# Patient Record
Sex: Female | Born: 1935 | Race: White | Hispanic: No | Smoking: Never smoker
Health system: Southern US, Community
[De-identification: ages and names within clinical notes are randomized; demographics above are authoritative.]

## PROBLEM LIST (undated history)

## (undated) DIAGNOSIS — F0390 Unspecified dementia without behavioral disturbance: Secondary | ICD-10-CM

## (undated) DIAGNOSIS — J189 Pneumonia, unspecified organism: Secondary | ICD-10-CM

---

## 2008-10-17 ENCOUNTER — Emergency Department (HOSPITAL_COMMUNITY): Admission: EM | Admit: 2008-10-17 | Discharge: 2008-10-17 | Payer: Self-pay | Admitting: Family Medicine

## 2009-06-20 ENCOUNTER — Emergency Department (HOSPITAL_BASED_OUTPATIENT_CLINIC_OR_DEPARTMENT_OTHER): Admission: EM | Admit: 2009-06-20 | Discharge: 2009-06-20 | Payer: Self-pay | Admitting: Emergency Medicine

## 2009-06-21 ENCOUNTER — Emergency Department (HOSPITAL_COMMUNITY): Admission: EM | Admit: 2009-06-21 | Discharge: 2009-06-21 | Payer: Self-pay | Admitting: Emergency Medicine

## 2009-06-22 ENCOUNTER — Emergency Department (HOSPITAL_COMMUNITY): Admission: EM | Admit: 2009-06-22 | Discharge: 2009-06-22 | Payer: Self-pay | Admitting: Emergency Medicine

## 2011-03-09 NOTE — Consult Note (Signed)
NAMEVERGIE, ZAHM              ACCOUNT NO.:  [id number]   MEDICAL RECORD NO.:  [id number]          PATIENT TYPE:  EMS   LOCATION:  MAJO                         FACILITY:  [facility]   PHYSICIAN:  Madelynn Done, MD  DATE OF BIRTH:  [birth year]   DATE OF CONSULTATION:  06/21/2009  DATE OF DISCHARGE:  06/21/2009                                 CONSULTATION   REASON FOR CONSULTATION:  Right small finger cat bite.   BRIEF HISTORY:  Ms. Haggart is an 77 year old right-hand-dominant female  who sustained a cat bite yesterday at 59.  She presented to the [facility] with a painful, swollen right small finger.  She was given IV antibiotics.  Came into the emergency department today  for a scheduled followup visit.  She comes in today without any new  complaints.  The patient states her finger feels a bit better and she is  accompanied by her daughter.  She denies any subjective fevers or  chills.  She denies any axillary pain or any other systemic signs of  infection.   PAST MEDICAL HISTORY:  1. Osteoporosis.  2. Skin cancer.   SOCIAL HISTORY:  She is a nonsmoker, nondrinker.  She lives in [place], [place].  She is here in [place] visiting.   MEDICATIONS:  Crestor and Fosamax.   ALLERGIES:  No known drug allergies.   REVIEW OF SYSTEMS:  No recent illnesses or hospitalization.   On examination, she is a healthy-appearing female.  Temp of 97.4, blood  pressure 101/52 with a heart rate of 80, respirations 18.  She has a  normal gait and station, good hand coordination, and normal mood.  She  is alert and oriented to person, place, and time in no acute distress.   On examination of the right hand, she does have some mild erythema and  swelling directly over the small finger from the MP joint distally.  No  fusiform swelling, minimal tenderness over the flexor tendon sheath, no  evidence flexor tenosynovitis.  She is able to gently wiggle her  fingers.  Her fingertips are warm and well perfused, able to make A-OK  sign, cross-fingers, and extend her thumb.  Fingertips does have good  capillary refill and good skin turgor.  She has no ascending erythema or  lymphangitis.   IMPRESSION:  Right small finger cat bite with surrounding cellulitis.   PLAN:  Today, the findings were reviewed with the patient.  The patient  is getting IV antibiotics in the emergency department today.  I think  she can continue with the oral antibiotics, splint immobilization, and  rest.  I do think she needs close followup.  She will be splinted today,  will be seen back in the emergency department tomorrow for repeat wound  check if her symptoms worsen or she has any ascending erythema or any  evidence of fluctuance, and I have said that the patient may require a  formal I and D.  She is going to be n.p.o. when she comes back to the  emergency department tomorrow.  All questions have been answered of her  and the daughter today.  No new  medications were given today.  The patient voiced understanding of plan  and the options.  We talked about inpatient admission, but she is a  rather compliant and her daughter understands the severity and they can  come in for close followup.      Madelynn Done, MD  Electronically Signed     FWO/MEDQ  D:  06/21/2009  T:  06/22/2009  Job:  [id number]

## 2013-07-10 ENCOUNTER — Emergency Department (HOSPITAL_BASED_OUTPATIENT_CLINIC_OR_DEPARTMENT_OTHER)
Admission: EM | Admit: 2013-07-10 | Discharge: 2013-07-11 | Disposition: A | Discharge status: Home / Self Care | Payer: Medicare Other | Attending: Emergency Medicine | Admitting: Emergency Medicine

## 2013-07-10 ENCOUNTER — Emergency Department (HOSPITAL_BASED_OUTPATIENT_CLINIC_OR_DEPARTMENT_OTHER): Payer: Medicare Other

## 2013-07-10 ENCOUNTER — Encounter (HOSPITAL_BASED_OUTPATIENT_CLINIC_OR_DEPARTMENT_OTHER): Payer: Self-pay | Admitting: *Deleted

## 2013-07-10 DIAGNOSIS — S20219A Contusion of unspecified front wall of thorax, initial encounter: Secondary | ICD-10-CM | POA: Insufficient documentation

## 2013-07-10 DIAGNOSIS — S20211A Contusion of right front wall of thorax, initial encounter: Secondary | ICD-10-CM

## 2013-07-10 DIAGNOSIS — Y939 Activity, unspecified: Secondary | ICD-10-CM | POA: Insufficient documentation

## 2013-07-10 DIAGNOSIS — Y929 Unspecified place or not applicable: Secondary | ICD-10-CM | POA: Insufficient documentation

## 2013-07-10 DIAGNOSIS — R296 Repeated falls: Secondary | ICD-10-CM | POA: Insufficient documentation

## 2013-07-10 NOTE — ED Notes (Signed)
Unwitnessed fall. Her daughter heard a thump and found her sitting on the floor. 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 C.o pain in her right ribs.

## 2013-07-10 NOTE — ED Provider Notes (Signed)
CSN: [id number]     Arrival date & time 07/10/13  2244 History  This chart was scribed for Amare Bail Smitty Cords, MD by Clydene Laming, ED Scribe. This patient was seen in room MH05/MH05 and the patient's care was started at 11:45PM.    Chief Complaint  Patient presents with  . Fall    Patient is a 79 y.o. female presenting with fall. The history is provided by the patient and a relative. No language interpreter was used.  Fall This is a new problem. The current episode started 3 to 5 hours ago. The problem occurs constantly. The problem has not changed since onset.Pertinent negatives include no abdominal pain, no headaches and no shortness of breath. Nothing aggravates the symptoms. Nothing relieves the symptoms. The treatment provided no relief.    HPI Comments: Alailah Safley is a 79 y.o. female who presents to the Emergency Department complaining of an unwitnessed fall that occurred at 8 PM tonight. Pt fell onto a chair, landing on her right rib cage. Pt denies any bruising. Pt has been taking Aleve twice a day. Did not strike head no LOC  History reviewed. No pertinent past medical history. History reviewed. No pertinent past surgical history. No family history on file. History  Substance Use Topics  . Smoking status: Never Smoker   . Smokeless tobacco: Not on file  . Alcohol Use: No   OB History   Grav Para Term Preterm Abortions TAB SAB Ect Mult Living                 Review of Systems  HENT: Negative for neck pain and neck stiffness.   Respiratory: Negative for chest tightness and shortness of breath.   Cardiovascular: Negative for palpitations.  Gastrointestinal: Negative for abdominal pain.  Neurological: Negative for dizziness, facial asymmetry, weakness, numbness and headaches.  All other systems reviewed and are negative.    Allergies  Review of patient's allergies indicates no known allergies.  Home Medications   Current Outpatient Rx  Name  Route  Sig   Dispense  Refill  . Rosuvastatin Calcium (CRESTOR PO)   Oral   Take by mouth.          TriaBP 120/63  Pulse 73  Temp(Src) 98.3 F (36.8 C) (Oral)  Resp 18  Wt 135 lb (61.236 kg)  SpO2 97% Physical Exam  Nursing note and vitals reviewed. Constitutional: She is oriented to person, place, and time. She appears well-developed and well-nourished.  HENT:  Head: Normocephalic and atraumatic.  Right Ear: Tympanic membrane normal. No tenderness.  Left Ear: Tympanic membrane normal. No tenderness.  Mouth/Throat: Oropharynx is clear and moist.  Moist mucus membranes. No bruising  Eyes: EOM are normal. Pupils are equal, round, and reactive to light.  Neck: Normal range of motion. Neck supple.  Cardiovascular: Normal rate, regular rhythm and normal heart sounds.   Pulmonary/Chest: Effort normal and breath sounds normal. She has no wheezes.  Lung sounds normal  Abdominal: Soft. Bowel sounds are normal. There is no tenderness. There is no rebound and no guarding.  Musculoskeletal: Normal range of motion.  No crepitus, bruising, or step-offs.   Neurological: She is alert and oriented to person, place, and time. She has normal reflexes.  Skin: Skin is warm and dry.  Psychiatric: She has a normal mood and affect. Her behavior is normal.    ED Course  Procedures (including critical care time)  DIAGNOSTIC STUDIES: Oxygen Saturation is 97% on RA, normal by my interpretation.  COORDINATION OF CARE:  11:52 PM- Discussed treatment plan with pt at bedside. Pt verbalized understanding and agreement with plan.    Labs Review Labs Reviewed - No data to display Imaging Review No results found.  MDM  No diagnosis found. Will treat with ultram and incentive spirometer  Bralyn Folkert K Kashius Dominic-Rasch, MD 07/11/13 [phone]

## 2013-07-11 MED ORDER — TRAMADOL HCL 50 MG PO TABS
50.0000 mg | ORAL_TABLET | Freq: Once | ORAL | Status: AC
  Administered 2013-07-11: 50 mg via ORAL
  Filled 2013-07-11: qty 1

## 2013-07-11 MED ORDER — TRAMADOL HCL 50 MG PO TABS: 50.0000 mg | ORAL_TABLET | Freq: Four times a day (QID) | ORAL | Status: DC | PRN

## 2013-10-15 ENCOUNTER — Ambulatory Visit: Payer: Self-pay | Admitting: Podiatry

## 2015-02-17 ENCOUNTER — Emergency Department (HOSPITAL_BASED_OUTPATIENT_CLINIC_OR_DEPARTMENT_OTHER)
Admission: EM | Admit: 2015-02-17 | Discharge: 2015-02-17 | Disposition: A | Discharge status: Home / Self Care | Payer: Medicare Other | Attending: Emergency Medicine | Admitting: Emergency Medicine

## 2015-02-17 ENCOUNTER — Emergency Department (HOSPITAL_BASED_OUTPATIENT_CLINIC_OR_DEPARTMENT_OTHER): Payer: Medicare Other

## 2015-02-17 ENCOUNTER — Encounter (HOSPITAL_BASED_OUTPATIENT_CLINIC_OR_DEPARTMENT_OTHER): Payer: Self-pay | Admitting: *Deleted

## 2015-02-17 DIAGNOSIS — Y9389 Activity, other specified: Secondary | ICD-10-CM | POA: Diagnosis not present

## 2015-02-17 DIAGNOSIS — S0031XA Abrasion of nose, initial encounter: Secondary | ICD-10-CM | POA: Diagnosis not present

## 2015-02-17 DIAGNOSIS — S161XXA Strain of muscle, fascia and tendon at neck level, initial encounter: Secondary | ICD-10-CM | POA: Diagnosis not present

## 2015-02-17 DIAGNOSIS — W19XXXA Unspecified fall, initial encounter: Secondary | ICD-10-CM

## 2015-02-17 DIAGNOSIS — Z8701 Personal history of pneumonia (recurrent): Secondary | ICD-10-CM | POA: Insufficient documentation

## 2015-02-17 DIAGNOSIS — Y998 Other external cause status: Secondary | ICD-10-CM | POA: Insufficient documentation

## 2015-02-17 DIAGNOSIS — W1830XA Fall on same level, unspecified, initial encounter: Secondary | ICD-10-CM | POA: Insufficient documentation

## 2015-02-17 DIAGNOSIS — S199XXA Unspecified injury of neck, initial encounter: Secondary | ICD-10-CM | POA: Diagnosis present

## 2015-02-17 DIAGNOSIS — Y9289 Other specified places as the place of occurrence of the external cause: Secondary | ICD-10-CM | POA: Diagnosis not present

## 2015-02-17 DIAGNOSIS — Z79899 Other long term (current) drug therapy: Secondary | ICD-10-CM | POA: Insufficient documentation

## 2015-02-17 HISTORY — DX: Pneumonia, unspecified organism: J18.9

## 2015-02-17 NOTE — ED Provider Notes (Signed)
CSN: [id number]     Arrival date & time 02/17/15  1707 History  This chart was scribed for Geoffery Lyonsouglas Cleva Camero, MD by Ronney LionSuzanne Le, ED Scribe. This patient was seen in room MH11/MH11 and the patient's care was started at 6:53 PM.     Chief Complaint  Patient presents with  . Fall   Patient is a 81 y.o. female presenting with fall. The history is provided by the patient and a relative. No language interpreter was used.  Fall This is a new problem. The current episode started 3 to 5 hours ago. The problem occurs rarely. The problem has been gradually improving. Pertinent negatives include no chest pain, no abdominal pain, no headaches and no shortness of breath. Nothing aggravates the symptoms. Nothing relieves the symptoms. She has tried nothing for the symptoms.    HPI Comments: Adriana FramesMildred Torres is a 81 y.o. female who presents to the Emergency Department s/p an unwitnessed fall that occurred PTA. Per daughters, they had found her kneeling at her bed and patient stated she fell. Her daughters don't think she struck anything hard; she has a rug burn as her apartment has carpets and appears to have caught herself on her knees and hands. She does however complain of mild, constant pain to her neck. Patient has a history of compression fractures in her back, which her daughters reports healed on their own. Her daughters denies blood thinner use. She takes Crestor and calcium regularly; no other chronic medical conditions. She denies SOB, chest pain, epistaxis, or abdominal pain.   Past Medical History  Diagnosis Date  . Pneumonia    History reviewed. No pertinent past surgical history. No family history on file. History  Substance Use Topics  . Smoking status: Never Smoker   . Smokeless tobacco: Not on file  . Alcohol Use: No   OB History    No data available     Review of Systems  HENT: Negative for nosebleeds.   Respiratory: Negative for shortness of breath.   Cardiovascular: Negative for chest  pain.  Gastrointestinal: Negative for abdominal pain.  Musculoskeletal: Positive for neck pain.  Skin: Positive for wound.  Neurological: Negative for headaches.  All other systems reviewed and are negative.   Allergies  Review of patient's allergies indicates no known allergies.  Home Medications   Prior to Admission medications   Medication Sig Start Date End Date Taking? Authorizing Provider  Rosuvastatin Calcium (CRESTOR PO) Take by mouth.    Historical Provider, MD  traMADol (ULTRAM) 50 MG tablet Take 1 tablet (50 mg total) by mouth every 6 (six) hours as needed for pain. 07/11/13   April Palumbo, MD   BP 136/78 mmHg  Pulse 82  Temp(Src) 98 F (36.7 C) (Oral)  Resp 20  Ht 5' (1.524 m)  Wt 135 lb (61.236 kg)  BMI 26.37 kg/m2  SpO2 98% Physical Exam  Constitutional: She is oriented to person, place, and time. She appears well-developed and well-nourished. No distress.  HENT:  Head: Normocephalic and atraumatic.  There are superficial abrasions to the bridge of the nose. There is no swelling, deformity, or septal hematoma.  Eyes: Conjunctivae and EOM are normal. Pupils are equal, round, and reactive to light.  Neck: Neck supple. No tracheal deviation present.  TTP in the soft tissues of the left lateral neck. No bony tenderness and no step-offs. Full ROM with minimal discomfort.  Cardiovascular: Normal rate.   Pulmonary/Chest: Effort normal. No respiratory distress.  Musculoskeletal: Normal range of motion.  Neurological: She is alert and oriented to person, place, and time. She has normal reflexes. No cranial nerve deficit. Coordination normal.  Skin: Skin is warm and dry.  Psychiatric: She has a normal mood and affect. Her behavior is normal.  Nursing note and vitals reviewed.   ED Course  Procedures (including critical care time)  DIAGNOSTIC STUDIES: Oxygen Saturation is 98% on room air, normal by my interpretation.    COORDINATION OF CARE: 7:04 PM - Discussed  imaging results and treatment plan with pt's family at bedside which includes wound care for the rug burn on her nose, and pt and her daughters agreed to plan.  Imaging Review Ct Cervical Spine Wo Contrast  02/17/2015   CLINICAL DATA:  81 year old female status post fall while going through [address]  EXAM: CT CERVICAL SPINE WITHOUT CONTRAST  TECHNIQUE: Multidetector CT imaging of the cervical spine was performed without intravenous contrast. Multiplanar CT image reconstructions were also generated.  COMPARISON:  None.  FINDINGS: 1.2 cm hypoechoic nodule in the deep aspect of the inferior left thyroid gland. No specific imaging follow-up is required. Visualized lung apex is are within normal limits. No suspicious adenopathy. No evidence of epidural mass or mass effect.  Normal aeration of the mastoid air cells. No acute fracture or malalignment. Multilevel cervical spondylosis with multilevel degenerative disc disease and mild bilateral facet arthropathy. Fusion of the posterior elements on the left at C4-C5 and on the right at C5-C6. No prevertebral soft tissue swelling.  IMPRESSION: 1. No acute fracture or malalignment. 2. Multilevel cervical spondylosis and facet arthropathy.   Electronically Signed   By: Malachy Moan M.D.   On: 02/17/2015 18:37   MDM   Final diagnoses:  None   81 year old for evaluation after a fall.  She has abrasions to the bridge of her nose and complains of neck pain.  CT is negative for fracture and facial injuries are superficial.  Will recommend bacitracin to the nose abrasions, prn return.  I personally performed the services described in this documentation, which was scribed in my presence. The recorded information has been reviewed and is accurate.      Geoffery Lyons, MD 02/18/15 [phone]

## 2015-02-17 NOTE — ED Notes (Signed)
Cervical collar removed.

## 2015-02-17 NOTE — ED Notes (Addendum)
[place]. Abrasion to her nose and pain to her knees. C/o neck pain. c collar applied at triage

## 2015-02-17 NOTE — ED Notes (Signed)
MD at bedside. 

## 2015-02-17 NOTE — Discharge Instructions (Signed)
Tylenol 650 mg every six hours as needed for pain.  Return to the ER for any new or concerning symptoms.   Cervical Sprain A cervical sprain is an injury in the neck in which the strong, fibrous tissues (ligaments) that connect your neck bones stretch or tear. Cervical sprains can range from mild to severe. Severe cervical sprains can cause the neck vertebrae to be unstable. This can lead to damage of the spinal cord and can result in serious nervous system problems. The amount of time it takes for a cervical sprain to get better depends on the cause and extent of the injury. Most cervical sprains heal in 1 to 3 weeks. CAUSES  Severe cervical sprains may be caused by:   Contact sport injuries (such as from football, rugby, wrestling, hockey, auto racing, gymnastics, diving, martial arts, or boxing).   Motor vehicle collisions.   Whiplash injuries. This is an injury from a sudden forward and backward whipping movement of the head and neck.  Falls.  Mild cervical sprains may be caused by:   Being in an awkward position, such as while cradling a telephone between your ear and shoulder.   Sitting in a chair that does not offer proper support.   Working at a poorly Marketing executive station.   Looking up or down for long periods of time.  SYMPTOMS   Pain, soreness, stiffness, or a burning sensation in the front, back, or sides of the neck. This discomfort may develop immediately after the injury or slowly, 24 hours or more after the injury.   Pain or tenderness directly in the middle of the back of the neck.   Shoulder or upper back pain.   Limited ability to move the neck.   Headache.   Dizziness.   Weakness, numbness, or tingling in the hands or arms.   Muscle spasms.   Difficulty swallowing or chewing.   Tenderness and swelling of the neck.  DIAGNOSIS  Most of the time your health care provider can diagnose a cervical sprain by taking your history and  doing a physical exam. Your health care provider will ask about previous neck injuries and any known neck problems, such as arthritis in the neck. X-rays may be taken to find out if there are any other problems, such as with the bones of the neck. Other tests, such as a CT scan or MRI, may also be needed.  TREATMENT  Treatment depends on the severity of the cervical sprain. Mild sprains can be treated with rest, keeping the neck in place (immobilization), and pain medicines. Severe cervical sprains are immediately immobilized. Further treatment is done to help with pain, muscle spasms, and other symptoms and may include:  Medicines, such as pain relievers, numbing medicines, or muscle relaxants.   Physical therapy. This may involve stretching exercises, strengthening exercises, and posture training. Exercises and improved posture can help stabilize the neck, strengthen muscles, and help stop symptoms from returning.  HOME CARE INSTRUCTIONS   Put ice on the injured area.   Put ice in a plastic bag.   Place a towel between your skin and the bag.   Leave the ice on for 15-20 minutes, 3-4 times a day.   If your injury was severe, you may have been given a cervical collar to wear. A cervical collar is a two-piece collar designed to keep your neck from moving while it heals.  Do not remove the collar unless instructed by your health care provider.  If you  have long hair, keep it outside of the collar.  Ask your health care provider before making any adjustments to your collar. Minor adjustments may be required over time to improve comfort and reduce pressure on your chin or on the back of your head.  Ifyou are allowed to remove the collar for cleaning or bathing, follow your health care provider's instructions on how to do so safely.  Keep your collar clean by wiping it with mild soap and water and drying it completely. If the collar you have been given includes removable pads, remove  them every 1-2 days and hand wash them with soap and water. Allow them to air dry. They should be completely dry before you wear them in the collar.  If you are allowed to remove the collar for cleaning and bathing, wash and dry the skin of your neck. Check your skin for irritation or sores. If you see any, tell your health care provider.  Do not drive while wearing the collar.   Only take over-the-counter or prescription medicines for pain, discomfort, or fever as directed by your health care provider.   Keep all follow-up appointments as directed by your health care provider.   Keep all physical therapy appointments as directed by your health care provider.   Make any needed adjustments to your workstation to promote good posture.   Avoid positions and activities that make your symptoms worse.   Warm up and stretch before being active to help prevent problems.  SEEK MEDICAL CARE IF:   Your pain is not controlled with medicine.   You are unable to decrease your pain medicine over time as planned.   Your activity level is not improving as expected.  SEEK IMMEDIATE MEDICAL CARE IF:   You develop any bleeding.  You develop stomach upset.  You have signs of an allergic reaction to your medicine.   Your symptoms get worse.   You develop new, unexplained symptoms.   You have numbness, tingling, weakness, or paralysis in any part of your body.  MAKE SURE YOU:   Understand these instructions.  Will watch your condition.  Will get help right away if you are not doing well or get worse. Document Released: 08/08/2007 Document Revised: 10/16/2013 Document Reviewed: 04/18/2013 [facility] Patient Information 2015 [place] [place]. This information is not intended to replace advice given to you by your health care provider. Make sure you discuss any questions you have with your health care provider.  Abrasion An abrasion is a cut or scrape of the skin. Abrasions do not  extend through all layers of the skin and most heal within 10 days. It is important to care for your abrasion properly to prevent infection. CAUSES  Most abrasions are caused by falling on, or gliding across, the ground or other surface. When your skin rubs on something, the outer and inner layer of skin rubs off, causing an abrasion. DIAGNOSIS  Your caregiver will be able to diagnose an abrasion during a physical exam.  TREATMENT  Your treatment depends on how large and deep the abrasion is. Generally, your abrasion will be cleaned with water and a mild soap to remove any dirt or debris. An antibiotic ointment may be put over the abrasion to prevent an infection. A bandage (dressing) may be wrapped around the abrasion to keep it from getting dirty.  You may need a tetanus shot if:  You cannot remember when you had your last tetanus shot.  You have never had a tetanus  shot.  The injury broke your skin. If you get a tetanus shot, your arm may swell, get red, and feel warm to the touch. This is common and not a problem. If you need a tetanus shot and you choose not to have one, there is a rare chance of getting tetanus. Sickness from tetanus can be serious.  HOME CARE INSTRUCTIONS   If a dressing was applied, change it at least once a day or as directed by your caregiver. If the bandage sticks, soak it off with warm water.   Wash the area with water and a mild soap to remove all the ointment 2 times a day. Rinse off the soap and pat the area dry with a clean towel.   Reapply any ointment as directed by your caregiver. This will help prevent infection and keep the bandage from sticking. Use gauze over the wound and under the dressing to help keep the bandage from sticking.   Change your dressing right away if it becomes wet or dirty.   Only take over-the-counter or prescription medicines for pain, discomfort, or fever as directed by your caregiver.   Follow up with your caregiver within  24-48 hours for a wound check, or as directed. If you were not given a wound-check appointment, look closely at your abrasion for redness, swelling, or pus. These are signs of infection. SEEK IMMEDIATE MEDICAL CARE IF:   You have increasing pain in the wound.   You have redness, swelling, or tenderness around the wound.   You have pus coming from the wound.   You have a fever or persistent symptoms for more than 2-3 days.  You have a fever and your symptoms suddenly get worse.  You have a bad smell coming from the wound or dressing.  MAKE SURE YOU:   Understand these instructions.  Will watch your condition.  Will get help right away if you are not doing well or get worse. Document Released: 07/21/2005 Document Revised: 09/27/2012 Document Reviewed: 09/14/2011 [facility] Patient Information 2015 [place], [place]. This information is not intended to replace advice given to you by your health care provider. Make sure you discuss any questions you have with your health care provider.

## 2017-01-28 ENCOUNTER — Encounter (HOSPITAL_COMMUNITY): Payer: Self-pay | Admitting: *Deleted

## 2017-01-28 ENCOUNTER — Emergency Department (HOSPITAL_COMMUNITY): Payer: Medicare Other

## 2017-01-28 ENCOUNTER — Inpatient Hospital Stay (HOSPITAL_COMMUNITY): Payer: Medicare Other

## 2017-01-28 ENCOUNTER — Inpatient Hospital Stay (HOSPITAL_COMMUNITY)
Admission: EM | Admit: 2017-01-28 | Discharge: 2017-01-30 | DRG: 194 | Disposition: A | Discharge status: Home / Self Care | Payer: Medicare Other | Attending: Internal Medicine | Admitting: Internal Medicine

## 2017-01-28 DIAGNOSIS — Z79899 Other long term (current) drug therapy: Secondary | ICD-10-CM

## 2017-01-28 DIAGNOSIS — R4182 Altered mental status, unspecified: Secondary | ICD-10-CM | POA: Diagnosis present

## 2017-01-28 DIAGNOSIS — R55 Syncope and collapse: Secondary | ICD-10-CM | POA: Diagnosis present

## 2017-01-28 DIAGNOSIS — R740 Nonspecific elevation of levels of transaminase and lactic acid dehydrogenase [LDH]: Secondary | ICD-10-CM | POA: Diagnosis present

## 2017-01-28 DIAGNOSIS — Z85828 Personal history of other malignant neoplasm of skin: Secondary | ICD-10-CM

## 2017-01-28 DIAGNOSIS — J189 Pneumonia, unspecified organism: Secondary | ICD-10-CM | POA: Diagnosis present

## 2017-01-28 DIAGNOSIS — W1830XA Fall on same level, unspecified, initial encounter: Secondary | ICD-10-CM | POA: Diagnosis present

## 2017-01-28 DIAGNOSIS — M810 Age-related osteoporosis without current pathological fracture: Secondary | ICD-10-CM | POA: Diagnosis present

## 2017-01-28 DIAGNOSIS — J9811 Atelectasis: Secondary | ICD-10-CM | POA: Diagnosis present

## 2017-01-28 DIAGNOSIS — W19XXXA Unspecified fall, initial encounter: Secondary | ICD-10-CM | POA: Diagnosis not present

## 2017-01-28 DIAGNOSIS — R918 Other nonspecific abnormal finding of lung field: Secondary | ICD-10-CM

## 2017-01-28 DIAGNOSIS — Y95 Nosocomial condition: Secondary | ICD-10-CM | POA: Diagnosis present

## 2017-01-28 DIAGNOSIS — Y92129 Unspecified place in nursing home as the place of occurrence of the external cause: Secondary | ICD-10-CM

## 2017-01-28 DIAGNOSIS — Z791 Long term (current) use of non-steroidal anti-inflammatories (NSAID): Secondary | ICD-10-CM | POA: Diagnosis not present

## 2017-01-28 DIAGNOSIS — Z885 Allergy status to narcotic agent status: Secondary | ICD-10-CM | POA: Diagnosis not present

## 2017-01-28 DIAGNOSIS — F0390 Unspecified dementia without behavioral disturbance: Secondary | ICD-10-CM | POA: Diagnosis present

## 2017-01-28 DIAGNOSIS — R32 Unspecified urinary incontinence: Secondary | ICD-10-CM | POA: Diagnosis present

## 2017-01-28 DIAGNOSIS — F03C0 Unspecified dementia, severe, without behavioral disturbance, psychotic disturbance, mood disturbance, and anxiety: Secondary | ICD-10-CM | POA: Diagnosis present

## 2017-01-28 DIAGNOSIS — Z66 Do not resuscitate: Secondary | ICD-10-CM | POA: Diagnosis present

## 2017-01-28 DIAGNOSIS — Z87440 Personal history of urinary (tract) infections: Secondary | ICD-10-CM

## 2017-01-28 DIAGNOSIS — R410 Disorientation, unspecified: Secondary | ICD-10-CM | POA: Diagnosis present

## 2017-01-28 LAB — RAPID URINE DRUG SCREEN, HOSP PERFORMED
Amphetamines: NOT DETECTED
BENZODIAZEPINES: NOT DETECTED
Barbiturates: NOT DETECTED
Cocaine: NOT DETECTED
Opiates: NOT DETECTED
Tetrahydrocannabinol: NOT DETECTED

## 2017-01-28 LAB — COMPREHENSIVE METABOLIC PANEL
ALT: 25 U/L (ref 14–54)
AST: 43 U/L — ABNORMAL HIGH (ref 15–41)
Albumin: 3.8 g/dL (ref 3.5–5.0)
Alkaline Phosphatase: 77 U/L (ref 38–126)
Anion gap: 11 (ref 5–15)
BUN: 13 mg/dL (ref 6–20)
CALCIUM: 9.1 mg/dL (ref 8.9–10.3)
CHLORIDE: 106 mmol/L (ref 101–111)
CO2: 20 mmol/L — ABNORMAL LOW (ref 22–32)
CREATININE: 0.89 mg/dL (ref 0.44–1.00)
GFR, EST NON AFRICAN AMERICAN: 53 mL/min — AB (ref >60)
Glucose, Bld: 144 mg/dL — ABNORMAL HIGH (ref 65–99)
Potassium: 4.2 mmol/L (ref 3.5–5.1)
Sodium: 137 mmol/L (ref 135–145)
Total Bilirubin: 0.4 mg/dL (ref 0.3–1.2)
Total Protein: 8.2 g/dL — ABNORMAL HIGH (ref 6.5–8.1)

## 2017-01-28 LAB — CBC
HCT: 42.1 % (ref 36.0–46.0)
HCT: 40 % (ref 36.0–46.0)
HEMOGLOBIN: 13.4 g/dL (ref 12.0–15.0)
Hemoglobin: 13.9 g/dL (ref 12.0–15.0)
MCH: 29.1 pg (ref 26.0–34.0)
MCH: 29.2 pg (ref 26.0–34.0)
MCHC: 33 g/dL (ref 30.0–36.0)
MCHC: 33.5 g/dL (ref 30.0–36.0)
MCV: 88.1 fL (ref 78.0–100.0)
MCV: 87.1 fL (ref 78.0–100.0)
PLATELETS: 214 10*3/uL (ref 150–400)
Platelets: 195 10*3/uL (ref 150–400)
RBC: 4.78 MIL/uL (ref 3.87–5.11)
RBC: 4.59 MIL/uL (ref 3.87–5.11)
RDW: 13.4 % (ref 11.5–15.5)
RDW: 13.6 % (ref 11.5–15.5)
WBC: 12.8 10*3/uL — AB (ref 4.0–10.5)
WBC: 11.5 10*3/uL — ABNORMAL HIGH (ref 4.0–10.5)

## 2017-01-28 LAB — URINALYSIS, ROUTINE W REFLEX MICROSCOPIC
Bilirubin Urine: NEGATIVE
Glucose, UA: NEGATIVE mg/dL
Ketones, ur: NEGATIVE mg/dL
Leukocytes, UA: NEGATIVE
NITRITE: NEGATIVE
Protein, ur: NEGATIVE mg/dL
SPECIFIC GRAVITY, URINE: 1.015 (ref 1.005–1.030)
pH: 7 (ref 5.0–8.0)

## 2017-01-28 LAB — I-STAT TROPONIN, ED: TROPONIN I, POC: 0 ng/mL (ref 0.00–0.08)

## 2017-01-28 LAB — DIFFERENTIAL
Basophils Absolute: 0 10*3/uL (ref 0.0–0.1)
Basophils Relative: 0 %
EOS PCT: 0 %
Eosinophils Absolute: 0 10*3/uL (ref 0.0–0.7)
Lymphocytes Relative: 3 %
Lymphs Abs: 0.4 10*3/uL — ABNORMAL LOW (ref 0.7–4.0)
MONO ABS: 0.8 10*3/uL (ref 0.1–1.0)
MONOS PCT: 6 %
NEUTROS ABS: 11.6 10*3/uL — AB (ref 1.7–7.7)
Neutrophils Relative %: 91 %

## 2017-01-28 LAB — PROCALCITONIN

## 2017-01-28 LAB — PROTIME-INR
INR: 1.07
Prothrombin Time: 14 seconds (ref 11.4–15.2)

## 2017-01-28 LAB — CREATININE, SERUM
Creatinine, Ser: 0.77 mg/dL (ref 0.44–1.00)
GFR calc non Af Amer: >60 mL/min (ref >60)

## 2017-01-28 LAB — AMMONIA: Ammonia: 15 umol/L (ref 9–35)

## 2017-01-28 LAB — STREP PNEUMONIAE URINARY ANTIGEN: Strep Pneumo Urinary Antigen: NEGATIVE

## 2017-01-28 LAB — MAGNESIUM: Magnesium: 1.9 mg/dL (ref 1.7–2.4)

## 2017-01-28 LAB — URINALYSIS, MICROSCOPIC (REFLEX)

## 2017-01-28 LAB — TROPONIN I
Troponin I: <0.03 ng/mL (ref <0.03)
Troponin I: <0.03 ng/mL (ref <0.03)

## 2017-01-28 LAB — INFLUENZA PANEL BY PCR (TYPE A & B)
INFLAPCR: NEGATIVE
INFLBPCR: NEGATIVE

## 2017-01-28 LAB — ETHANOL

## 2017-01-28 LAB — TSH: TSH: 1.192 u[IU]/mL (ref 0.350–4.500)

## 2017-01-28 LAB — MRSA PCR SCREENING: MRSA BY PCR: NEGATIVE

## 2017-01-28 LAB — APTT: aPTT: 27 seconds (ref 24–36)

## 2017-01-28 MED ORDER — ACETAMINOPHEN 325 MG PO TABS
650.0000 mg | ORAL_TABLET | Freq: Four times a day (QID) | ORAL | Status: DC | PRN
  Administered 2017-01-30: 650 mg via ORAL
  Filled 2017-01-28 (×2): qty 2

## 2017-01-28 MED ORDER — ONDANSETRON HCL 4 MG/2ML IJ SOLN: 4.0000 mg | Freq: Four times a day (QID) | INTRAMUSCULAR | Status: DC | PRN

## 2017-01-28 MED ORDER — VANCOMYCIN HCL IN DEXTROSE 1-5 GM/200ML-% IV SOLN
1000.0000 mg | Freq: Once | INTRAVENOUS | Status: AC
  Administered 2017-01-28: 1000 mg via INTRAVENOUS
  Filled 2017-01-28: qty 200

## 2017-01-28 MED ORDER — ACETAMINOPHEN 650 MG RE SUPP: 650.0000 mg | Freq: Four times a day (QID) | RECTAL | Status: DC | PRN

## 2017-01-28 MED ORDER — DEXTROSE 5 % IV SOLN
2.0000 g | Freq: Once | INTRAVENOUS | Status: AC
  Administered 2017-01-28: 2 g via INTRAVENOUS
  Filled 2017-01-28: qty 2

## 2017-01-28 MED ORDER — ORAL CARE MOUTH RINSE
15.0000 mL | Freq: Two times a day (BID) | OROMUCOSAL | Status: DC
  Administered 2017-01-28 – 2017-01-30 (×3): 15 mL via OROMUCOSAL

## 2017-01-28 MED ORDER — ENOXAPARIN SODIUM 40 MG/0.4ML ~~LOC~~ SOLN
40.0000 mg | SUBCUTANEOUS | Status: DC
  Administered 2017-01-28 – 2017-01-29 (×2): 40 mg via SUBCUTANEOUS
  Filled 2017-01-28 (×2): qty 0.4

## 2017-01-28 MED ORDER — VANCOMYCIN HCL IN DEXTROSE 1-5 GM/200ML-% IV SOLN
1000.0000 mg | INTRAVENOUS | Status: DC
  Filled 2017-01-28: qty 200

## 2017-01-28 MED ORDER — SODIUM CHLORIDE 0.9 % IV SOLN
INTRAVENOUS | Status: DC
  Administered 2017-01-28: 100 mL/h via INTRAVENOUS
  Administered 2017-01-29: 01:00:00 via INTRAVENOUS

## 2017-01-28 MED ORDER — LEVALBUTEROL HCL 0.63 MG/3ML IN NEBU
0.6300 mg | INHALATION_SOLUTION | Freq: Four times a day (QID) | RESPIRATORY_TRACT | Status: DC
  Administered 2017-01-28 (×2): 0.63 mg via RESPIRATORY_TRACT
  Filled 2017-01-28 (×2): qty 3

## 2017-01-28 MED ORDER — TRAMADOL HCL 50 MG PO TABS: 50.0000 mg | ORAL_TABLET | Freq: Four times a day (QID) | ORAL | Status: DC | PRN

## 2017-01-28 MED ORDER — DEXTROSE 5 % IV SOLN
1.0000 g | INTRAVENOUS | Status: DC
  Administered 2017-01-29: 1 g via INTRAVENOUS
  Filled 2017-01-28 (×2): qty 1

## 2017-01-28 MED ORDER — ONDANSETRON HCL 4 MG PO TABS: 4.0000 mg | ORAL_TABLET | Freq: Four times a day (QID) | ORAL | Status: DC | PRN

## 2017-01-28 MED ORDER — SODIUM CHLORIDE 0.9% FLUSH
3.0000 mL | Freq: Two times a day (BID) | INTRAVENOUS | Status: DC
  Administered 2017-01-28 – 2017-01-29 (×2): 3 mL via INTRAVENOUS

## 2017-01-28 NOTE — Progress Notes (Signed)
Pharmacy Antibiotic Note  Adriana Torres is a 83 y.o. female admitted on 01/28/2017 with pneumonia.  Pharmacy has been consulted for vancomycin and cefepime dosing. Temperature not yet documented, WBC elevated at 12.8 and SCr is WNL. Pt was on keflex and bactrim PTA.  Plan: Vancomycin 1gm IV Q24H Cefepime 2gm IV x 1 then 1gm IV Q24H F/u renal fxn, C&S, clinical status and trough at [facility]     No data recorded.   Recent Labs Lab 01/28/17 0923  WBC 12.8*  CREATININE 0.89    CrCl cannot be calculated (Unknown ideal weight.).    Allergies  Allergen Reactions  . Tramadol Other (See Comments)    Syncope    Antimicrobials this admission: Vanc 4/6>> Cefepime 4/6>>  Dose adjustments this admission: N/A  Microbiology results: Pending  Thank you for allowing pharmacy to be a part of this patient's care.  Jaxn Chiquito, Drake Leach 01/28/2017 12:35 PM

## 2017-01-28 NOTE — ED Notes (Signed)
Maxipime being brought over by pharmacy.

## 2017-01-28 NOTE — ED Notes (Signed)
Patient transported to X-ray 

## 2017-01-28 NOTE — ED Notes (Signed)
ED Provider at bedside. 

## 2017-01-28 NOTE — ED Provider Notes (Signed)
[facility]-EMERGENCY DEPT Provider Note   CSN: [id number] Arrival date & time: 01/28/17  0849     History   Chief Complaint Chief Complaint  Patient presents with  . Fall  . Altered Mental Status    HPI Adriana Torres is a 83 y.o. female.  HPI Patient presented to the emergency room from a nursing home after having an unwitnessed fall. According to the EMS report the patient was last seen approximately 30 minutes prior to being found down on the ground. It is unclear though when her last normal time was. Patient normally is alert and oriented to to herself and can speak.  This morning however she is unresponsive. Patient was noted to be incontinent of urine. She had dried blood on her face and some in her hair.  Patient does answer my question after a sternal rub. She responds with a simple yes or no but I cannot get any significant history from her. Past Medical History:  Diagnosis Date  . Pneumonia     There are no active problems to display for this patient.   No past surgical history on file.  OB History    No data available       Home Medications    Prior to Admission medications   Medication Sig Start Date End Date Taking? Authorizing Provider  Calcium Citrate-Vitamin D (CITRACAL PETITES/VITAMIN D) 200-250 MG-UNIT TABS Take 2 tablets by mouth 2 (two) times daily.   Yes Historical Provider, MD  cephALEXin (KEFLEX) 250 MG capsule Take 250 mg by mouth 4 (four) times daily. UTI Prevention   Yes Historical Provider, MD  furosemide (LASIX) 20 MG tablet Take 10 mg by mouth.   Yes Historical Provider, MD  naproxen sodium (ANAPROX) 220 MG tablet Take 220 mg by mouth 2 (two) times daily with a meal.   Yes Historical Provider, MD  sulfamethoxazole-trimethoprim (BACTRIM DS,SEPTRA DS) 800-160 MG tablet Take 1 tablet by mouth 2 (two) times daily. 01/26/17 01/30/17 Yes Historical Provider, MD  traMADol (ULTRAM) 50 MG tablet Take 1 tablet (50 mg total) by mouth every 6 (six) hours as needed  for pain. Patient not taking: Reported on 01/28/2017 07/11/13   April Palumbo, MD    Family History No family history on file.  Social History Social History  Substance Use Topics  . Smoking status: Never Smoker  . Smokeless tobacco: Not on file  . Alcohol use No     Allergies   Tramadol   Review of Systems Review of Systems  Unable to perform ROS: Mental status change     Physical Exam Updated Vital Signs BP (!) 141/84   Pulse (!) 51   Resp 17   SpO2 100%   Physical Exam  Constitutional: She appears lethargic.  HENT:  Head: Normocephalic.  Right Ear: External ear normal. No hemotympanum.  Left Ear: External ear normal. No hemotympanum.  Dried blood on the left side of her hair and face, no obvious facial deformity  Eyes: Conjunctivae are normal. Right eye exhibits no discharge. Left eye exhibits no discharge. No scleral icterus.  Neck: Neck supple. No tracheal deviation present.  Cardiovascular: Normal rate, regular rhythm and intact distal pulses.   Pulmonary/Chest: Effort normal and breath sounds normal. No stridor. No respiratory distress. She has no wheezes. She has no rales.  Abdominal: Soft. Bowel sounds are normal. She exhibits no distension. There is no tenderness. There is no rebound and no guarding.  Musculoskeletal: She exhibits no edema or tenderness.  No gross  deformities noted, mild abrasion of her left shoulder  Neurological: She appears lethargic. She displays tremor. No cranial nerve deficit (no facial droop,  no slurred speech ). She exhibits normal muscle tone. She displays no seizure activity. GCS eye subscore is 2. GCS verbal subscore is 4. GCS motor subscore is 6.  After a sternal rub patient did eventually open her eyes, she then would answer a couple of simple yes no questions.  When I held her hands up in the year she was able to resist against gravity somewhat, I could not get her to move either of her feet, I do not see any obvious facial  droop, she did have a tremor as she went to reach with her left hand  Skin: Skin is warm and dry. No rash noted. She is not diaphoretic.  Psychiatric: She has a normal mood and affect.  Nursing note and vitals reviewed.    ED Treatments / Results  Labs (all labs ordered are listed, but only abnormal results are displayed) Labs Reviewed  CBC - Abnormal; Notable for the following:       Result Value   WBC 12.8 (*)    All other components within normal limits  DIFFERENTIAL - Abnormal; Notable for the following:    Neutro Abs 11.6 (*)    Lymphs Abs 0.4 (*)    All other components within normal limits  COMPREHENSIVE METABOLIC PANEL - Abnormal; Notable for the following:    CO2 20 (*)    Glucose, Bld 144 (*)    Total Protein 8.2 (*)    AST 43 (*)    GFR calc non Af Amer 53 (*)    All other components within normal limits  URINALYSIS, ROUTINE W REFLEX MICROSCOPIC - Abnormal; Notable for the following:    Hgb urine dipstick SMALL (*)    All other components within normal limits  URINALYSIS, MICROSCOPIC (REFLEX) - Abnormal; Notable for the following:    Bacteria, UA RARE (*)    Squamous Epithelial / LPF 0-5 (*)    All other components within normal limits  ETHANOL  PROTIME-INR  APTT  RAPID URINE DRUG SCREEN, HOSP PERFORMED  AMMONIA  I-STAT TROPOININ, ED    EKG  EKG Interpretation  Date/Time:  Friday January 28 2017 09:18:15 EDT Ventricular Rate:  79 PR Interval:    QRS Duration: 92 QT Interval:  406 QTC Calculation: 466 R Axis:   56 Text Interpretation:  Sinus rhythm Atrial premature complexes Borderline T wave abnormalities No old tracing to compare Confirmed by Shannyn Jankowiak  MD-J, Shani Fitch ([id number]) on 01/28/2017 9:23:51 AM       Radiology Dg Pelvis 1-2 Views  Result Date: 01/28/2017 CLINICAL DATA:  Status post fall. EXAM: PELVIS - 1-2 VIEW COMPARISON:  None. FINDINGS: There is no evidence of hip fracture. Slight irregularity of the inferior ramus of the right pelvis may represent an  age-indeterminate fracture. No evidence of pelvic diastases. No evidence of suspicious osseous lesions. Moderate amount of formed stool throughout the colon suggestive of constipation. IMPRESSION: No evidence of hip fractures. Well corticated irregularity of the inferior right pubic ramus may represent an age-indeterminate fracture, favor chronic. Electronically Signed   By: Ted Mcalpine M.D.   On: 01/28/2017 10:09   Ct Head Wo Contrast  Result Date: 01/28/2017 CLINICAL DATA:  Fall, left head pain/injury, altered mental status EXAM: CT HEAD WITHOUT CONTRAST CT CERVICAL SPINE WITHOUT CONTRAST TECHNIQUE: Multidetector CT imaging of the head and cervical spine was performed following the  standard protocol without intravenous contrast. Multiplanar CT image reconstructions of the cervical spine were also generated. COMPARISON:  CT cervical spine dated 02/17/2015 FINDINGS: CT HEAD FINDINGS Brain: No evidence of acute infarction, hemorrhage, hydrocephalus, extra-axial collection or mass lesion/mass effect. Subcortical white matter and periventricular small vessel ischemic changes. Global cortical and central atrophy. Secondary ventricular prominence. Vascular: Mild intracranial atherosclerosis. Skull: Normal. Negative for fracture or focal lesion. Sinuses/Orbits: The visualized paranasal sinuses are essentially clear. The mastoid air cells are unopacified. Other: None. CT CERVICAL SPINE FINDINGS Alignment: Normal cervical lordosis. Skull base and vertebrae: No acute fracture. No primary bone lesion or focal pathologic process. Soft tissues and spinal canal: No prevertebral soft tissue swelling. Spinal canal is widely patent. Disc levels:  Mild multilevel degenerative changes. Upper chest: Visualized lung apices are clear. Other: Visualized thyroid is mildly heterogeneous/nodular. IMPRESSION: No evidence of acute intracranial abnormality. Atrophy with small vessel ischemic changes. No evidence of traumatic injury  to the cervical spine. Mild multilevel degenerative changes. Electronically Signed   By: Charline Bills M.D.   On: 01/28/2017 10:21   Ct Cervical Spine Wo Contrast  Result Date: 01/28/2017 CLINICAL DATA:  Fall, left head pain/injury, altered mental status EXAM: CT HEAD WITHOUT CONTRAST CT CERVICAL SPINE WITHOUT CONTRAST TECHNIQUE: Multidetector CT imaging of the head and cervical spine was performed following the standard protocol without intravenous contrast. Multiplanar CT image reconstructions of the cervical spine were also generated. COMPARISON:  CT cervical spine dated 02/17/2015 FINDINGS: CT HEAD FINDINGS Brain: No evidence of acute infarction, hemorrhage, hydrocephalus, extra-axial collection or mass lesion/mass effect. Subcortical white matter and periventricular small vessel ischemic changes. Global cortical and central atrophy. Secondary ventricular prominence. Vascular: Mild intracranial atherosclerosis. Skull: Normal. Negative for fracture or focal lesion. Sinuses/Orbits: The visualized paranasal sinuses are essentially clear. The mastoid air cells are unopacified. Other: None. CT CERVICAL SPINE FINDINGS Alignment: Normal cervical lordosis. Skull base and vertebrae: No acute fracture. No primary bone lesion or focal pathologic process. Soft tissues and spinal canal: No prevertebral soft tissue swelling. Spinal canal is widely patent. Disc levels:  Mild multilevel degenerative changes. Upper chest: Visualized lung apices are clear. Other: Visualized thyroid is mildly heterogeneous/nodular. IMPRESSION: No evidence of acute intracranial abnormality. Atrophy with small vessel ischemic changes. No evidence of traumatic injury to the cervical spine. Mild multilevel degenerative changes. Electronically Signed   By: Charline Bills M.D.   On: 01/28/2017 10:21   Dg Chest Portable 1 View  Result Date: 01/28/2017 CLINICAL DATA:  Fall.  Altered mental status. EXAM: PORTABLE CHEST 1 VIEW COMPARISON:   01/23/2015 . FINDINGS: Stable cardiomegaly. Mild right perihilar infiltrate cannot be excluded. Low lung volumes with basilar atelectasis. No pleural effusion or pneumothorax. IMPRESSION: 1. Mild right perihilar infiltrate cannot be excluded. Low lung volumes with mild bibasilar atelectasis. 2. Stable cardiomegaly. Electronically Signed   By: Maisie Fus  Register   On: 01/28/2017 09:30    Procedures Procedures (including critical care time)  Medications Ordered in ED Medications - No data to display   Initial Impression / Assessment and Plan / ED Course  I have reviewed the triage vital signs and the nursing notes.  Pertinent labs & imaging results that were available during my care of the patient were reviewed by me and considered in my medical decision making (see chart for details).  Clinical Course as of Jan 28 1209  Fri Jan 28, 2017  1105 The patient is more alert now.  Her eyes are open. She is answering questions.  [  JK]  1208 Pt is awake and answering questions. She is still confused however her family member states her speech pattern now was typical for her. Patient does complain of some shoulder soreness. She does have an abrasion there. I will add on x-ray of the shoulder.  [JK]    Clinical Course User Index [JK] Linwood Dibbles, MD    Patient presented to the emergency room after being found down with minimal responsiveness at the nursing facility.  She had some dried blood on her face.  Did not see an obvious scalp laceration but I suspect a small laceration is the source of bleeding. There is no other external signs of injury. Patient's head CT and cervical spine T CT are negative for acute injury. Her pelvis x-ray shows signs of an old fracture. The patient's family states she does have a history of an old injury. Laboratory tests do show an elevated white blood cell count. Family member states that she been diagnosed with a urinary tract infection recently. It's possible this contributed  to the fall and weakness today. A syncopal episode, seizure or occult CVA is still a possibility.  Urinalysis pending. Plan on consultation with the medical service for admission and further monitoring.  Final Clinical Impressions(s) / ED Diagnoses   Final diagnoses:  Fall, initial encounter  Confusion      Linwood Dibbles, MD 01/28/17 1210

## 2017-01-28 NOTE — ED Triage Notes (Addendum)
Pt arrives EMS from Nursing home after unwitnessed fall. EMS told last seen 30 minutes prior to found down. Dried blood at side of face. Pt norm is alert, oriented to self now responsive to painful stimuli. Abundant incontinent of urine.

## 2017-01-28 NOTE — Progress Notes (Signed)
Pt arrived to unit from ED.  Telemetry applied and CCMD notified x 2.  Pt with dementia.  Pt plan of care discussed with Pt's daughters.  All questions answered.  Pt in low bed with floor mats.  Will cont to monitor.

## 2017-01-28 NOTE — H&P (Addendum)
[facility] History and Physical  Adriana Torres NFA:[id number] DOB: [birth year] DOA: 01/28/2017  Referring physician:   PCP: Nadara Eaton, MD   Chief Complaint:   Fall  HPI:  83 year old female with a history of dementia, skin cancer, osteoporosis, resident of a memory care unit, DO NOT RESUSCITATE, who presents to the ER, brought in via EMS after a fall at the nursing home. Patient was found face down for an unknown period of time. Patient is unable to provide any history. Most of the history is obtained from ED notes as well as the patient's daughters who are by the bedside. According to them patient has had multiple recent urinary tract infections that they call very confused. Prior to this admission patient was being treated for UTI with Bactrim. Patient presented with a hematoma on the left side of her face along with some dried blood on her face and scalp. She also complains of left shoulder pain. Unable to assess for any gross focal neurologic deficits, unable to assess speech as the patient is just moaning  ED course BP (!) 141/84   Pulse (!) 51   Resp 17   SpO2 100%  Patient's head CT and cervical spine T CT are negative for acute injury. Her pelvis x-ray shows signs of an old fracture. The patient's family states she does have a history of an old injury. Laboratory tests do show an elevated white blood cell count. Marland Kitchen UA negative. Chest x-ray shows possible right perihilar infiltrate     Review of Systems: negative for the following     Unable to perform ROS: Mental status change    Past Medical History:  Diagnosis Date  . Pneumonia      No past surgical history on file.    Social History:  reports that she has never smoked. She does not have any smokeless tobacco history on file. She reports that she does not drink alcohol or use drugs.    Allergies  Allergen Reactions  . Tramadol Other (See Comments)    Syncope        FAMILY HISTORY  When  questioned  Directly-patient reports  No family history of HTN, CVA ,DIABETES, TB, Cancer CAD, Bleeding Disorders, Sickle Cell, diabetes, anemia, asthma,   Prior to Admission medications   Medication Sig Start Date End Date Taking? Authorizing Provider  Calcium Citrate-Vitamin D (CITRACAL PETITES/VITAMIN D) 200-250 MG-UNIT TABS Take 2 tablets by mouth 2 (two) times daily.   Yes Historical Provider, MD  cephALEXin (KEFLEX) 250 MG capsule Take 250 mg by mouth 4 (four) times daily. UTI Prevention   Yes Historical Provider, MD  furosemide (LASIX) 20 MG tablet Take 10 mg by mouth.   Yes Historical Provider, MD  naproxen sodium (ANAPROX) 220 MG tablet Take 220 mg by mouth 2 (two) times daily with a meal.   Yes Historical Provider, MD  sulfamethoxazole-trimethoprim (BACTRIM DS,SEPTRA DS) 800-160 MG tablet Take 1 tablet by mouth 2 (two) times daily. 01/26/17 01/30/17 Yes Historical Provider, MD  traMADol (ULTRAM) 50 MG tablet Take 1 tablet (50 mg total) by mouth every 6 (six) hours as needed for pain. Patient not taking: Reported on 01/28/2017 07/11/13   April Palumbo, MD     Physical Exam: Vitals:   01/28/17 1130 01/28/17 1145 01/28/17 1200 01/28/17 1215  BP: (!) 126/100 (!) 141/84 (!) 156/57 (!) 128/96  Pulse: 78 (!) 51 85 78  Resp: 16 17 17 16   SpO2: 99% 100% 98% 98%  Vitals:   01/28/17 1130 01/28/17 1145 01/28/17 1200 01/28/17 1215  BP: (!) 126/100 (!) 141/84 (!) 156/57 (!) 128/96  Pulse: 78 (!) 51 85 78  Resp: SpO2: 99% 100% 98% 98%  Constitutional: She appears lethargicElderly.  HENT:  Head: Normocephalic.  Right Ear: External ear normal. No hemotympanum.  Left Ear: External ear normal. No hemotympanum.  Dried blood on the left side of her hair and face, no obvious facial deformity  Eyes: Conjunctivae are normal. Right eye exhibits no discharge. Left eye exhibits no discharge. No scleral icterus.  Neck: Neck supple. No tracheal deviation present.  Cardiovascular:  Normal rate, regular rhythm and intact distal pulses.   Pulmonary/Chest: Effort normal and breath sounds normal. No stridor. No respiratory distress. She has no wheezes. She has no rales.  Abdominal: Soft. Bowel sounds are normal. She exhibits no distension. There is no tenderness. There is no rebound and no guarding.  Musculoskeletal: She exhibits no edema or tenderness.  No gross deformities noted, mild abrasion of her left shoulder  Neurological: She appears lethargic. She displays tremor. No cranial nerve deficit (no facial droop,  no slurred speech ). She exhibits normal muscle tone. She displays no seizure activity. GCS eye subscore is 2. GCS verbal subscore is 4. GCS motor subscore is 6.  Psychiatric: Normal judgment and insight. Alert and oriented x 3. Normal mood.     Labs on Admission: I have personally reviewed following labs and imaging studies  CBC:  Recent Labs Lab 01/28/17 0923  WBC 12.8*  NEUTROABS 11.6*  HGB 13.9  HCT 42.1  MCV 88.1  PLT 195    Basic Metabolic Panel:  Recent Labs Lab 01/28/17 0923  NA 137  K 4.2  CL 106  CO2 20*  GLUCOSE 144*  BUN 13  CREATININE 0.89  CALCIUM 9.1    GFR: CrCl cannot be calculated (Unknown ideal weight.).  Liver Function Tests:  Recent Labs Lab 01/28/17 0923  AST 43*  ALT 25  ALKPHOS 77  BILITOT 0.4  PROT 8.2*  ALBUMIN 3.8   No results for input(s): LIPASE, AMYLASE in the last 168 hours.  Recent Labs Lab 01/28/17 0923  AMMONIA 15    Coagulation Profile:  Recent Labs Lab 01/28/17 0923  INR 1.07   No results for input(s): DDIMER in the last 72 hours.  Cardiac Enzymes: No results for input(s): CKTOTAL, CKMB, CKMBINDEX, TROPONINI in the last 168 hours.  BNP (last 3 results) No results for input(s): PROBNP in the last 8760 hours.  HbA1C: No results for input(s): HGBA1C in the last 72 hours. No results found for: HGBA1C   CBG: No results for input(s): GLUCAP in the last 168 hours.  Lipid  Profile: No results for input(s): CHOL, HDL, LDLCALC, TRIG, CHOLHDL, LDLDIRECT in the last 72 hours.  Thyroid Function Tests: No results for input(s): TSH, T4TOTAL, FREET4, T3FREE, THYROIDAB in the last 72 hours.  Anemia Panel: No results for input(s): VITAMINB12, FOLATE, FERRITIN, TIBC, IRON, RETICCTPCT in the last 72 hours.  Urine analysis:    Component Value Date/Time   COLORURINE YELLOW 01/28/2017 1108   APPEARANCEUR CLEAR 01/28/2017 1108   LABSPEC 1.015 01/28/2017 1108   PHURINE 7.0 01/28/2017 1108   GLUCOSEU NEGATIVE 01/28/2017 1108   HGBUR SMALL (A) 01/28/2017 1108   BILIRUBINUR NEGATIVE 01/28/2017 1108   KETONESUR NEGATIVE 01/28/2017 1108   PROTEINUR NEGATIVE 01/28/2017 1108   NITRITE NEGATIVE 01/28/2017 1108   LEUKOCYTESUR NEGATIVE 01/28/2017 1108  Sepsis Labs: (procalcitonin:4,lacticidven:4) )No results found for this or any previous visit (from the past 240 hour(s)).       Radiological Exams on Admission: Dg Pelvis 1-2 Views  Result Date: 01/28/2017 CLINICAL DATA:  Status post fall. EXAM: PELVIS - 1-2 VIEW COMPARISON:  None. FINDINGS: There is no evidence of hip fracture. Slight irregularity of the inferior ramus of the right pelvis may represent an age-indeterminate fracture. No evidence of pelvic diastases. No evidence of suspicious osseous lesions. Moderate amount of formed stool throughout the colon suggestive of constipation. IMPRESSION: No evidence of hip fractures. Well corticated irregularity of the inferior right pubic ramus may represent an age-indeterminate fracture, favor chronic. Electronically Signed   By: Ted Mcalpine M.D.   On: 01/28/2017 10:09   Ct Head Wo Contrast  Result Date: 01/28/2017 CLINICAL DATA:  Fall, left head pain/injury, altered mental status EXAM: CT HEAD WITHOUT CONTRAST CT CERVICAL SPINE WITHOUT CONTRAST TECHNIQUE: Multidetector CT imaging of the head and cervical spine was performed following the standard protocol  without intravenous contrast. Multiplanar CT image reconstructions of the cervical spine were also generated. COMPARISON:  CT cervical spine dated 02/17/2015 FINDINGS: CT HEAD FINDINGS Brain: No evidence of acute infarction, hemorrhage, hydrocephalus, extra-axial collection or mass lesion/mass effect. Subcortical white matter and periventricular small vessel ischemic changes. Global cortical and central atrophy. Secondary ventricular prominence. Vascular: Mild intracranial atherosclerosis. Skull: Normal. Negative for fracture or focal lesion. Sinuses/Orbits: The visualized paranasal sinuses are essentially clear. The mastoid air cells are unopacified. Other: None. CT CERVICAL SPINE FINDINGS Alignment: Normal cervical lordosis. Skull base and vertebrae: No acute fracture. No primary bone lesion or focal pathologic process. Soft tissues and spinal canal: No prevertebral soft tissue swelling. Spinal canal is widely patent. Disc levels:  Mild multilevel degenerative changes. Upper chest: Visualized lung apices are clear. Other: Visualized thyroid is mildly heterogeneous/nodular. IMPRESSION: No evidence of acute intracranial abnormality. Atrophy with small vessel ischemic changes. No evidence of traumatic injury to the cervical spine. Mild multilevel degenerative changes. Electronically Signed   By: Charline Bills M.D.   On: 01/28/2017 10:21   Ct Cervical Spine Wo Contrast  Result Date: 01/28/2017 CLINICAL DATA:  Fall, left head pain/injury, altered mental status EXAM: CT HEAD WITHOUT CONTRAST CT CERVICAL SPINE WITHOUT CONTRAST TECHNIQUE: Multidetector CT imaging of the head and cervical spine was performed following the standard protocol without intravenous contrast. Multiplanar CT image reconstructions of the cervical spine were also generated. COMPARISON:  CT cervical spine dated 02/17/2015 FINDINGS: CT HEAD FINDINGS Brain: No evidence of acute infarction, hemorrhage, hydrocephalus, extra-axial collection or  mass lesion/mass effect. Subcortical white matter and periventricular small vessel ischemic changes. Global cortical and central atrophy. Secondary ventricular prominence. Vascular: Mild intracranial atherosclerosis. Skull: Normal. Negative for fracture or focal lesion. Sinuses/Orbits: The visualized paranasal sinuses are essentially clear. The mastoid air cells are unopacified. Other: None. CT CERVICAL SPINE FINDINGS Alignment: Normal cervical lordosis. Skull base and vertebrae: No acute fracture. No primary bone lesion or focal pathologic process. Soft tissues and spinal canal: No prevertebral soft tissue swelling. Spinal canal is widely patent. Disc levels:  Mild multilevel degenerative changes. Upper chest: Visualized lung apices are clear. Other: Visualized thyroid is mildly heterogeneous/nodular. IMPRESSION: No evidence of acute intracranial abnormality. Atrophy with small vessel ischemic changes. No evidence of traumatic injury to the cervical spine. Mild multilevel degenerative changes. Electronically Signed   By: Charline Bills M.D.   On: 01/28/2017 10:21   Dg Chest Portable 1 View  Result Date: 01/28/2017  CLINICAL DATA:  Fall.  Altered mental status. EXAM: PORTABLE CHEST 1 VIEW COMPARISON:  01/23/2015 . FINDINGS: Stable cardiomegaly. Mild right perihilar infiltrate cannot be excluded. Low lung volumes with basilar atelectasis. No pleural effusion or pneumothorax. IMPRESSION: 1. Mild right perihilar infiltrate cannot be excluded. Low lung volumes with mild bibasilar atelectasis. 2. Stable cardiomegaly. Electronically Signed   By: Maisie Fus  Register   On: 01/28/2017 09:30   Dg Pelvis 1-2 Views  Result Date: 01/28/2017 CLINICAL DATA:  Status post fall. EXAM: PELVIS - 1-2 VIEW COMPARISON:  None. FINDINGS: There is no evidence of hip fracture. Slight irregularity of the inferior ramus of the right pelvis may represent an age-indeterminate fracture. No evidence of pelvic diastases. No evidence of  suspicious osseous lesions. Moderate amount of formed stool throughout the colon suggestive of constipation. IMPRESSION: No evidence of hip fractures. Well corticated irregularity of the inferior right pubic ramus may represent an age-indeterminate fracture, favor chronic. Electronically Signed   By: Ted Mcalpine M.D.   On: 01/28/2017 10:09   Ct Head Wo Contrast  Result Date: 01/28/2017 CLINICAL DATA:  Fall, left head pain/injury, altered mental status EXAM: CT HEAD WITHOUT CONTRAST CT CERVICAL SPINE WITHOUT CONTRAST TECHNIQUE: Multidetector CT imaging of the head and cervical spine was performed following the standard protocol without intravenous contrast. Multiplanar CT image reconstructions of the cervical spine were also generated. COMPARISON:  CT cervical spine dated 02/17/2015 FINDINGS: CT HEAD FINDINGS Brain: No evidence of acute infarction, hemorrhage, hydrocephalus, extra-axial collection or mass lesion/mass effect. Subcortical white matter and periventricular small vessel ischemic changes. Global cortical and central atrophy. Secondary ventricular prominence. Vascular: Mild intracranial atherosclerosis. Skull: Normal. Negative for fracture or focal lesion. Sinuses/Orbits: The visualized paranasal sinuses are essentially clear. The mastoid air cells are unopacified. Other: None. CT CERVICAL SPINE FINDINGS Alignment: Normal cervical lordosis. Skull base and vertebrae: No acute fracture. No primary bone lesion or focal pathologic process. Soft tissues and spinal canal: No prevertebral soft tissue swelling. Spinal canal is widely patent. Disc levels:  Mild multilevel degenerative changes. Upper chest: Visualized lung apices are clear. Other: Visualized thyroid is mildly heterogeneous/nodular. IMPRESSION: No evidence of acute intracranial abnormality. Atrophy with small vessel ischemic changes. No evidence of traumatic injury to the cervical spine. Mild multilevel degenerative changes. Electronically  Signed   By: Charline Bills M.D.   On: 01/28/2017 10:21   Ct Cervical Spine Wo Contrast  Result Date: 01/28/2017 CLINICAL DATA:  Fall, left head pain/injury, altered mental status EXAM: CT HEAD WITHOUT CONTRAST CT CERVICAL SPINE WITHOUT CONTRAST TECHNIQUE: Multidetector CT imaging of the head and cervical spine was performed following the standard protocol without intravenous contrast. Multiplanar CT image reconstructions of the cervical spine were also generated. COMPARISON:  CT cervical spine dated 02/17/2015 FINDINGS: CT HEAD FINDINGS Brain: No evidence of acute infarction, hemorrhage, hydrocephalus, extra-axial collection or mass lesion/mass effect. Subcortical white matter and periventricular small vessel ischemic changes. Global cortical and central atrophy. Secondary ventricular prominence. Vascular: Mild intracranial atherosclerosis. Skull: Normal. Negative for fracture or focal lesion. Sinuses/Orbits: The visualized paranasal sinuses are essentially clear. The mastoid air cells are unopacified. Other: None. CT CERVICAL SPINE FINDINGS Alignment: Normal cervical lordosis. Skull base and vertebrae: No acute fracture. No primary bone lesion or focal pathologic process. Soft tissues and spinal canal: No prevertebral soft tissue swelling. Spinal canal is widely patent. Disc levels:  Mild multilevel degenerative changes. Upper chest: Visualized lung apices are clear. Other: Visualized thyroid is mildly heterogeneous/nodular. IMPRESSION: No evidence of acute intracranial  abnormality. Atrophy with small vessel ischemic changes. No evidence of traumatic injury to the cervical spine. Mild multilevel degenerative changes. Electronically Signed   By: Charline Bills M.D.   On: 01/28/2017 10:21   Dg Chest Portable 1 View  Result Date: 01/28/2017 CLINICAL DATA:  Fall.  Altered mental status. EXAM: PORTABLE CHEST 1 VIEW COMPARISON:  01/23/2015 . FINDINGS: Stable cardiomegaly. Mild right perihilar infiltrate  cannot be excluded. Low lung volumes with basilar atelectasis. No pleural effusion or pneumothorax. IMPRESSION: 1. Mild right perihilar infiltrate cannot be excluded. Low lung volumes with mild bibasilar atelectasis. 2. Stable cardiomegaly. Electronically Signed   By: Maisie Fus  Register   On: 01/28/2017 09:30      EKG: Independently reviewed.    EKG Interpretation  Date/Time:                  Friday January 28 2017 09:18:15 EDT Ventricular Rate:   79 PR Interval:                        QRS Duration:        92 QT Interval:                      406 QTC Calculation:    466 R Axis:                         56 Text Interpretation:  Sinus rhythm Atrial premature complexes Borderline T wave abnormalities No old tracing to compare     Assessment/Plan Probable healthcare associated pneumonia Based on possible right perihilar infiltrate on chest x-ray, Patient has been started on cefepime and vancomycin Repeat chest x-ray tomorrow Check pro-calcitonin, if negative DC antibiotics tomorrow Speech therapy evaluation    Fall/ Syncope, vasovagal, vs arrhythmia, vs TIA Could consider MRI of the brain if no improvement in focal deficits However at this point family would like to be conservative Initial CT does not show any evidence of acute intracranial abnormality UDS negative Left shoulder x ray pending    Recurrent UTI , was on bactrim prior to admission Renal function stable UA negative  DVT prophylaxis:  Lovenox   CODE DNR   consults called: None   Family Communication: Admission, patients condition and plan of care including tests being ordered have been discussed with the patient's 2 daughter   who indicates understanding and agree with the plan and Code Status  Admission status: inpatient    Disposition plan: Further plan will depend as patient's clinical course evolves and further radiologic and laboratory data become available. Likely home when stable   At the time of  admission, it appears that the appropriate admission status for this patient is INPATIENT .Thisis judged to be reasonable and necessary in order to provide the required intensity of service to ensure the patient's safetygiven thepresenting symptoms, physical exam findings, and initial radiographic and laboratory data in the context of their chronic comorbidities.   Richarda Overlie MD [facility] Pager [phone]  If 7PM-7AM, please contact night-coverage [email or web address] Password [facility]  01/28/2017, 12:47 PM

## 2017-01-28 NOTE — ED Notes (Signed)
Pt cleaned of  Dried blood from left side of face. Bruising noted to left side of face.

## 2017-01-28 NOTE — ED Notes (Signed)
Pt returns from xray and left sided facial droop noted but pt now upright and face ois clean but left side is somewhat swollen and bruised. Dr. Lynelle Doctor aware.

## 2017-01-29 ENCOUNTER — Inpatient Hospital Stay (HOSPITAL_COMMUNITY): Payer: Medicare Other

## 2017-01-29 ENCOUNTER — Encounter (HOSPITAL_COMMUNITY): Payer: Self-pay | Admitting: *Deleted

## 2017-01-29 LAB — COMPREHENSIVE METABOLIC PANEL
ALT: 24 U/L (ref 14–54)
ANION GAP: 10 (ref 5–15)
AST: 46 U/L — ABNORMAL HIGH (ref 15–41)
Albumin: 3.3 g/dL — ABNORMAL LOW (ref 3.5–5.0)
Alkaline Phosphatase: 57 U/L (ref 38–126)
BUN: 13 mg/dL (ref 6–20)
CALCIUM: 8.6 mg/dL — AB (ref 8.9–10.3)
CHLORIDE: 108 mmol/L (ref 101–111)
CO2: 20 mmol/L — AB (ref 22–32)
Creatinine, Ser: 0.79 mg/dL (ref 0.44–1.00)
GFR calc non Af Amer: >60 mL/min (ref >60)
Glucose, Bld: 119 mg/dL — ABNORMAL HIGH (ref 65–99)
Potassium: 3.9 mmol/L (ref 3.5–5.1)
SODIUM: 138 mmol/L (ref 135–145)
Total Bilirubin: 1 mg/dL (ref 0.3–1.2)
Total Protein: 6.6 g/dL (ref 6.5–8.1)

## 2017-01-29 LAB — CBC
HCT: 36.9 % (ref 36.0–46.0)
HEMOGLOBIN: 12 g/dL (ref 12.0–15.0)
MCH: 28.6 pg (ref 26.0–34.0)
MCHC: 32.5 g/dL (ref 30.0–36.0)
MCV: 87.9 fL (ref 78.0–100.0)
Platelets: 203 10*3/uL (ref 150–400)
RBC: 4.2 MIL/uL (ref 3.87–5.11)
RDW: 13.7 % (ref 11.5–15.5)
WBC: 8.5 10*3/uL (ref 4.0–10.5)

## 2017-01-29 LAB — TROPONIN I: Troponin I: <0.03 ng/mL (ref <0.03)

## 2017-01-29 LAB — HIV ANTIBODY (ROUTINE TESTING W REFLEX): HIV SCREEN 4TH GENERATION: NONREACTIVE

## 2017-01-29 MED ORDER — LEVALBUTEROL HCL 0.63 MG/3ML IN NEBU
0.6300 mg | INHALATION_SOLUTION | Freq: Two times a day (BID) | RESPIRATORY_TRACT | Status: DC
  Administered 2017-01-29 – 2017-01-30 (×3): 0.63 mg via RESPIRATORY_TRACT
  Filled 2017-01-29 (×3): qty 3

## 2017-01-29 NOTE — Progress Notes (Signed)
Patient ID: Adriana Torres, female   DOB: Nov 30, 1918, 83 y.o.   MRN: [id number]  PROGRESS NOTE    Adriana Torres  NWG:[id number] DOB: 1919/09/28 DOA: 01/28/2017 PCP: Nadara Eaton, MD   Brief Narrative: 83 year old female with a history of dementia, skin cancer, osteoporosis, resident of a memory care unit presented with fall and altered mental status. She was admitted with probable pneumonia. She has tolerated diet per speech therapy recommendations.  Assessment & Plan: 1. Fall: mechanical vs syncope. Unlikely due to stroke. Fall precautions. PT eval. 2. Question of pneumonia: repeat cxr showing pneumonia vs atelectasis. Patient is afebrile with no supplemental oxygen requirement without much cough. DC vancomycin today. Either DC cefepime tomorrow or switch to oral levaquin if there is still a concern for pneumonia. Cultures negative so far.  procalcitonin negative on admission. Diet as per speech therapy recommendations 3. Syncope: vasovagal, vs arrhythmia, vs TIA. Will monitor. Treat conservatively as per family recommendations. Hence, will not order MRI of brain.  4. Leukocytosis: improved; reactive vs due to pneumonia 5. Altered mental status: improving. Monitor mental status 6. Dementia 7. hitory of Recurrent UTI:  was on bactrim prior to admission. Bactrim has been discontinued. Renal function stable. UA negative  8. Mild transaminitis: questionable cause  DVT prophylaxis:  Lovenox Code Status:  DNR Family Communication: Plan of care was discussed with two daughters present at bedside during rounds today. Disposition Plan: DC to Memory care unit in AM if patient remains stable.  Consultants:  None  Procedures:  None  Antimicrobials: Cefepime and Vanc 01/28/17  Subjective: Patient is sleepy. Wakes up on calling her name. Answers some questions. Confused. No overnight fever, nausea  Or vomiting.  Objective: Vitals:   01/28/17 2030 01/28/17 2302 01/29/17 0631 01/29/17 0819    BP:  136/71 (!) 142/96   Pulse:  93 84   Resp:  18 18   Temp:  98.1 F (36.7 C) 97.3 F (36.3 C)   TempSrc:  Oral Oral   SpO2: 95% 94% 94% 92%  Weight:      Height:        Intake/Output Summary (Last 24 hours) at 01/29/17 1305 Last data filed at 01/29/17 0900  Gross per 24 hour  Intake          1411.67 ml  Output                0 ml  Net          1411.67 ml   Filed Weights   01/28/17 1624  Weight: 74.8 kg (165 lb)    Examination:  General exam: Appears calm and comfortable  Respiratory system: Clear to auscultation. Respiratory effort normal. Cardiovascular system: S1 & S2 heard, RRR.  Gastrointestinal system: soft, non tender. Bowel sounds positive. Central nervous system: sleepy; wakes up on calling her name; answers some questions; confused; moving extremities. Slight left sided facial droop. Extremities: no cyanosis, clubbing, edema. Skin: No rashes, lesions or ulcers     Data Reviewed: I have personally reviewed following labs and imaging studies  CBC:  Recent Labs Lab 01/28/17 0923 01/28/17 1358 01/29/17 0028  WBC 12.8* 11.5* 8.5  NEUTROABS 11.6*  --   --   HGB 13.9 13.4 12.0  HCT 42.1 40.0 36.9  MCV 88.1 87.1 87.9  PLT 195 214 203   Basic Metabolic Panel:  Recent Labs Lab 01/28/17 0923 01/28/17 1358 01/29/17 0028  NA 137  --  138  K 4.2  --  3.9  CL 106  --  108  CO2 20*  --  20*  GLUCOSE 144*  --  119*  BUN 13  --  13  CREATININE 0.89 0.77 0.79  CALCIUM 9.1  --  8.6*  MG  --  1.9  --    GFR: Estimated Creatinine Clearance: 41.5 mL/min (by C-G formula based on SCr of 0.79 mg/dL). Liver Function Tests:  Recent Labs Lab 01/28/17 0923 01/29/17 0028  AST 43* 46*  ALT 25 24  ALKPHOS 77 57  BILITOT 0.4 1.0  PROT 8.2* 6.6  ALBUMIN 3.8 3.3*   No results for input(s): LIPASE, AMYLASE in the last 168 hours.  Recent Labs Lab 01/28/17 0923  AMMONIA 15   Coagulation Profile:  Recent Labs Lab 01/28/17 0923  INR 1.07    Cardiac Enzymes:  Recent Labs Lab 01/28/17 1358 01/28/17 1748 01/29/17 0028  TROPONINI <0.03 <0.03 <0.03   BNP (last 3 results) No results for input(s): PROBNP in the last 8760 hours. HbA1C: No results for input(s): HGBA1C in the last 72 hours. CBG: No results for input(s): GLUCAP in the last 168 hours. Lipid Profile: No results for input(s): CHOL, HDL, LDLCALC, TRIG, CHOLHDL, LDLDIRECT in the last 72 hours. Thyroid Function Tests:  Recent Labs  01/28/17 1358  TSH 1.192   Anemia Panel: No results for input(s): VITAMINB12, FOLATE, FERRITIN, TIBC, IRON, RETICCTPCT in the last 72 hours. Sepsis Labs:  Recent Labs Lab 01/28/17 1358  PROCALCITON <0.10    Recent Results (from the past 240 hour(s))  MRSA PCR Screening     Status: None   Collection Time: 01/28/17  8:03 PM  Result Value Ref Range Status   MRSA by PCR NEGATIVE NEGATIVE Final    Comment:        The GeneXpert MRSA Assay (FDA approved for NASAL specimens only), is one component of a comprehensive MRSA colonization surveillance program. It is not intended to diagnose MRSA infection nor to guide or monitor treatment for MRSA infections.          Radiology Studies: Dg Pelvis 1-2 Views  Result Date: 01/28/2017 CLINICAL DATA:  Status post fall. EXAM: PELVIS - 1-2 VIEW COMPARISON:  None. FINDINGS: There is no evidence of hip fracture. Slight irregularity of the inferior ramus of the right pelvis may represent an age-indeterminate fracture. No evidence of pelvic diastases. No evidence of suspicious osseous lesions. Moderate amount of formed stool throughout the colon suggestive of constipation. IMPRESSION: No evidence of hip fractures. Well corticated irregularity of the inferior right pubic ramus may represent an age-indeterminate fracture, favor chronic. Electronically Signed   By: Ted Mcalpine M.D.   On: 01/28/2017 10:09   Ct Head Wo Contrast  Result Date: 01/28/2017 CLINICAL DATA:  Fall, left  head pain/injury, altered mental status EXAM: CT HEAD WITHOUT CONTRAST CT CERVICAL SPINE WITHOUT CONTRAST TECHNIQUE: Multidetector CT imaging of the head and cervical spine was performed following the standard protocol without intravenous contrast. Multiplanar CT image reconstructions of the cervical spine were also generated. COMPARISON:  CT cervical spine dated 02/17/2015 FINDINGS: CT HEAD FINDINGS Brain: No evidence of acute infarction, hemorrhage, hydrocephalus, extra-axial collection or mass lesion/mass effect. Subcortical white matter and periventricular small vessel ischemic changes. Global cortical and central atrophy. Secondary ventricular prominence. Vascular: Mild intracranial atherosclerosis. Skull: Normal. Negative for fracture or focal lesion. Sinuses/Orbits: The visualized paranasal sinuses are essentially clear. The mastoid air cells are unopacified. Other: None. CT CERVICAL SPINE FINDINGS Alignment: Normal cervical lordosis. Skull base and vertebrae: No  acute fracture. No primary bone lesion or focal pathologic process. Soft tissues and spinal canal: No prevertebral soft tissue swelling. Spinal canal is widely patent. Disc levels:  Mild multilevel degenerative changes. Upper chest: Visualized lung apices are clear. Other: Visualized thyroid is mildly heterogeneous/nodular. IMPRESSION: No evidence of acute intracranial abnormality. Atrophy with small vessel ischemic changes. No evidence of traumatic injury to the cervical spine. Mild multilevel degenerative changes. Electronically Signed   By: Charline Bills M.D.   On: 01/28/2017 10:21   Ct Cervical Spine Wo Contrast  Result Date: 01/28/2017 CLINICAL DATA:  Fall, left head pain/injury, altered mental status EXAM: CT HEAD WITHOUT CONTRAST CT CERVICAL SPINE WITHOUT CONTRAST TECHNIQUE: Multidetector CT imaging of the head and cervical spine was performed following the standard protocol without intravenous contrast. Multiplanar CT image  reconstructions of the cervical spine were also generated. COMPARISON:  CT cervical spine dated 02/17/2015 FINDINGS: CT HEAD FINDINGS Brain: No evidence of acute infarction, hemorrhage, hydrocephalus, extra-axial collection or mass lesion/mass effect. Subcortical white matter and periventricular small vessel ischemic changes. Global cortical and central atrophy. Secondary ventricular prominence. Vascular: Mild intracranial atherosclerosis. Skull: Normal. Negative for fracture or focal lesion. Sinuses/Orbits: The visualized paranasal sinuses are essentially clear. The mastoid air cells are unopacified. Other: None. CT CERVICAL SPINE FINDINGS Alignment: Normal cervical lordosis. Skull base and vertebrae: No acute fracture. No primary bone lesion or focal pathologic process. Soft tissues and spinal canal: No prevertebral soft tissue swelling. Spinal canal is widely patent. Disc levels:  Mild multilevel degenerative changes. Upper chest: Visualized lung apices are clear. Other: Visualized thyroid is mildly heterogeneous/nodular. IMPRESSION: No evidence of acute intracranial abnormality. Atrophy with small vessel ischemic changes. No evidence of traumatic injury to the cervical spine. Mild multilevel degenerative changes. Electronically Signed   By: Charline Bills M.D.   On: 01/28/2017 10:21   Dg Chest Port 1 View  Result Date: 01/29/2017 CLINICAL DATA:  Syncope.  History of pneumonia EXAM: PORTABLE CHEST 1 VIEW COMPARISON:  Yesterday FINDINGS: Cardiomegaly which is accentuated by rotation, as is the upper mediastinal widening. Low volume chest with interstitial coarsening. There is interstitial crowding and hazy density at the bases. No Kerley lines, effusion, or pneumothorax. IMPRESSION: Limited low volume chest that is stable from yesterday. Pneumonia or atelectasis may be present at the bases. Electronically Signed   By: Marnee Spring M.D.   On: 01/29/2017 08:18   Dg Chest Portable 1 View  Result Date:  01/28/2017 CLINICAL DATA:  Fall.  Altered mental status. EXAM: PORTABLE CHEST 1 VIEW COMPARISON:  01/23/2015 . FINDINGS: Stable cardiomegaly. Mild right perihilar infiltrate cannot be excluded. Low lung volumes with basilar atelectasis. No pleural effusion or pneumothorax. IMPRESSION: 1. Mild right perihilar infiltrate cannot be excluded. Low lung volumes with mild bibasilar atelectasis. 2. Stable cardiomegaly. Electronically Signed   By: Maisie Fus  Register   On: 01/28/2017 09:30   Dg Shoulder Left  Result Date: 01/28/2017 CLINICAL DATA:  Fall. EXAM: LEFT SHOULDER - 2+ VIEW COMPARISON:  Chest x-ray 01/28/2017. FINDINGS: Acromioclavicular and glenohumeral degenerative change. Diffuse osteopenia. No evidence of fracture, dislocation, or separation. IMPRESSION: No acute abnormality . Electronically Signed   By: Maisie Fus  Register   On: 01/28/2017 13:06        Scheduled Meds: . ceFEPime (MAXIPIME) IV  1 g Intravenous Q24H  . enoxaparin (LOVENOX) injection  40 mg Subcutaneous Q24H  . levalbuterol  0.63 mg Nebulization BID  . mouth rinse  15 mL Mouth Rinse BID  . sodium chloride flush  3 mL Intravenous Q12H  . vancomycin  1,000 mg Intravenous Q24H   Continuous Infusions: . sodium chloride 100 mL/hr at 01/29/17 0653    LOS: 1 more day       Glade Lloyd, MD [facility] Pager [phone]  If 7PM-7AM, please contact night-coverage [email or web address] Password [email or web address] 01/29/2017, 1:05 PM

## 2017-01-29 NOTE — Evaluation (Signed)
Physical Therapy Evaluation Patient Details Name: Adriana Torres MRN: [id number] DOB: Mar 18, 1919 Today's Date: 01/29/2017   History of Present Illness  Pt is a 83 y/o female admitted from a Memory Care unit secondary to fall and AMS. Pt with probable HCAP. PMH including but not limited to dementia and skin cancer.  Clinical Impression  Pt presented supine in bed with daughters present. Pt was very lethargic, which limited participation. However, pt was noted to have been incontinent of urine in bed; therefore, therapist assisted with bed mobility and peri care. Pt's daughters reported that she was previously ambulating with use of rollator and required assistance for ADLs. Pt would continue to benefit from skilled physical therapy services at this time while admitted and after d/c to address the below listed limitations in order to improve overall safety and independence with functional mobility.      Follow Up Recommendations Other (comment) (back to her Memory Care Unit with PT services)    Equipment Recommendations  None recommended by PT    Recommendations for Other Services       Precautions / Restrictions Precautions Precautions: Fall Restrictions Weight Bearing Restrictions: No      Mobility  Bed Mobility Overal bed mobility: Needs Assistance Bed Mobility: Rolling Rolling: Mod assist         General bed mobility comments: pt able to roll bilaterally with use of bed rails (with multi modal cueing) for peri care. After rolling, pt with increased WOB and still lethargic  Transfers                 General transfer comment: deferred secondary to level of arousal  Ambulation/Gait                Stairs            Wheelchair Mobility    Modified Rankin (Stroke Patients Only)       Balance Overall balance assessment: History of Falls                                           Pertinent Vitals/Pain Pain Assessment:  Faces Faces Pain Scale: Hurts little more Pain Location: back Pain Descriptors / Indicators: Sore Pain Intervention(s): Monitored during session;Repositioned    Home Living Family/patient expects to be discharged to:: Assisted living               Home Equipment: Walker - 4 wheels Additional Comments: pt resides at a Memory Care Unit     Prior Function Level of Independence: Needs assistance   Gait / Transfers Assistance Needed: pt ambulates with use of rollator  ADL's / Homemaking Assistance Needed: requires assistance from assisted living staff for ADLs. pt can feed herself.  Comments: information provided by pt's daughters     Hand Dominance        Extremity/Trunk Assessment   Upper Extremity Assessment Upper Extremity Assessment: Generalized weakness    Lower Extremity Assessment Lower Extremity Assessment: Generalized weakness       Communication   Communication: No difficulties  Cognition Arousal/Alertness: Lethargic Behavior During Therapy: Flat affect Overall Cognitive Status: History of cognitive impairments - at baseline Area of Impairment: Following commands;Awareness;Problem solving                       Following Commands: Follows one step commands inconsistently;Follows one step commands with increased  time     Problem Solving: Slow processing;Decreased initiation;Difficulty sequencing;Requires verbal cues;Requires tactile cues General Comments: pt with dementia at baseline      General Comments      Exercises     Assessment/Plan    PT Assessment Patient needs continued PT services  PT Problem List Decreased strength;Decreased activity tolerance;Decreased balance;Decreased mobility;Decreased cognition;Decreased coordination;Decreased knowledge of use of DME;Decreased safety awareness;Cardiopulmonary status limiting activity;Decreased knowledge of precautions;Pain       PT Treatment Interventions DME instruction;Gait  training;Functional mobility training;Therapeutic activities;Therapeutic exercise;Balance training;Neuromuscular re-education;Patient/family education    PT Goals (Current goals can be found in the Care Plan section)  Acute Rehab PT Goals Patient Stated Goal: daughters would like for pt to return to her assisted living and return to ambulating PT Goal Formulation: With family Time For Goal Achievement: 02/12/17 Potential to Achieve Goals: Fair    Frequency Min 2X/week   Barriers to discharge        Co-evaluation               End of Session   Activity Tolerance: Patient limited by lethargy;Patient limited by fatigue;Patient limited by pain Patient left: in bed;with call bell/phone within reach;with family/visitor present Nurse Communication: Mobility status PT Visit Diagnosis: Other abnormalities of gait and mobility (R26.89)    Time: 1400-1415 PT Time Calculation (min) (ACUTE ONLY): 15 min   Charges:   PT Evaluation $PT Eval Moderate Complexity: 1 Procedure     PT G Codes:        Adriana Torres, PT, DPT [phone]   Adriana Torres Adriana Torres 01/29/2017, 3:45 PM

## 2017-01-29 NOTE — Evaluation (Signed)
Clinical/Bedside Swallow Evaluation Patient Details  Name: Adriana Torres MRN: [id number] Date of Birth: [birth year]  Today's Date: 01/29/2017 Time: SLP Start Time (ACUTE ONLY): 0841 SLP Stop Time (ACUTE ONLY): 0850 SLP Time Calculation (min) (ACUTE ONLY): 9 min  Past Medical History:  Past Medical History:  Diagnosis Date  . Pneumonia    Past Surgical History: No past surgical history on file. HPI:  83 year old female with a history of dementia, skin cancer, osteoporosis, resident of a memory care unit, DO NOT RESUSCITATE, who presented to the ER, brought in via EMS after a fall at the nursing home. Prior to this admission patient was being treated for UTI. Patient presented with a hematoma on the left side of her face along with some dried blood on her face and scalp. Head CT 01/28/17 with no evidence of acute intracranial abnormality. CXR 01/29/17 Pneumonia or atelectasis may be present at the bases. Mild right perihilar infiltrate cannot be excluded. No prior swallowing evaluations in chart.    Assessment / Plan / Recommendation Clinical Impression  Pt presents with oropharyngeal swallow which appears to be grossly within functional limits with adequate airway protection at bedside. Pt has dementia, is oriented to first name only, disoriented to time, situation, location. She is fully alert and able to retrive boluses with spoon, straw and cup when provided with set-up. She does have mild left sided facial droop which does not appear to impact bolus containment or mastication. No family available to provide additional history, but patient has no documented difficulties with swallowing. She is observed with functional mastication, adequate oral clearance, appearance of timely swallow initiation, and no overt signs of aspiration despite challenging with multiple consecutive boluses of thin liquid via straw and cup sips. Recommend regular diet with thin liquids, meds whole with liquid. Given cognitive  impairment, patient is likely to require assistance for set-up, to assist with upright posture and would benefit from intermittent checks to ensure alertness, attention to PO. No further follow-up is recommended at this time. SLP will s/o.  SLP Visit Diagnosis: Dysphagia, unspecified (R13.10)    Aspiration Risk  Mild aspiration risk    Diet Recommendation Regular;Thin liquid   Liquid Administration via: Cup;Straw Medication Administration: Whole meds with liquid Supervision: Comment;Intermittent supervision to cue for compensatory strategies (Pt will likely need setup, int checks d/t cog impairment) Compensations: Minimize environmental distractions;Slow rate;Small sips/bites Postural Changes: Seated upright at 90 degrees    Other  Recommendations Oral Care Recommendations: Oral care BID   Follow up Recommendations None      Frequency and Duration            Prognosis        Swallow Study   General Date of Onset: 01/29/17 HPI: 83 year old female with a history of dementia, skin cancer, osteoporosis, resident of a memory care unit, DO NOT RESUSCITATE, who presented to the ER, brought in via EMS after a fall at the nursing home. Prior to this admission patient was being treated for UTI. Patient presented with a hematoma on the left side of her face along with some dried blood on her face and scalp. Head CT 01/28/17 with no evidence of acute intracranial abnormality. CXR 01/29/17 Pneumonia or atelectasis may be present at the bases. Mild right perihilar infiltrate cannot be excluded. No prior swallowing evaluations in chart.  Type of Study: Bedside Swallow Evaluation Previous Swallow Assessment: none in chart Diet Prior to this Study: NPO Temperature Spikes Noted: No Respiratory Status: Room air History of Recent  Intubation: No Behavior/Cognition: Alert;Cooperative;Confused;Pleasant mood Oral Cavity Assessment: Within Functional Limits Oral Care Completed by SLP: No Oral Cavity -  Dentition: Dentures, top;Adequate natural dentition Vision: Functional for self-feeding Self-Feeding Abilities: Able to feed self;Needs set up Patient Positioning: Upright in bed Baseline Vocal Quality: Normal Volitional Cough: Strong Volitional Swallow: Able to elicit    Oral/Motor/Sensory Function Overall Oral Motor/Sensory Function: Mild impairment Facial ROM: Reduced left Facial Symmetry: Abnormal symmetry left Facial Strength: Reduced left Lingual ROM: Within Functional Limits Lingual Symmetry: Within Functional Limits Lingual Strength: Within Functional Limits Mandible: Within Functional Limits   Ice Chips Ice chips: Within functional limits Presentation: Spoon   Thin Liquid Thin Liquid: Within functional limits Presentation: Cup;Straw;Self Fed    Nectar Thick Nectar Thick Liquid: Not tested   Honey Thick Honey Thick Liquid: Not tested   Puree Puree: Within functional limits Presentation: Self Fed;Spoon   Solid   GO   Solid: Within functional limits Presentation: Self Aaron Mose 01/29/2017,9:07 AM  Rondel Baton, MS CF-SLP Speech-Language Pathologist [phone]

## 2017-01-30 DIAGNOSIS — F0390 Unspecified dementia without behavioral disturbance: Secondary | ICD-10-CM

## 2017-01-30 DIAGNOSIS — F03C0 Unspecified dementia, severe, without behavioral disturbance, psychotic disturbance, mood disturbance, and anxiety: Secondary | ICD-10-CM | POA: Diagnosis present

## 2017-01-30 LAB — CBC WITH DIFFERENTIAL/PLATELET
BASOS ABS: 0 10*3/uL (ref 0.0–0.1)
Basophils Relative: 1 %
EOS ABS: 0.2 10*3/uL (ref 0.0–0.7)
Eosinophils Relative: 3 %
HEMATOCRIT: 37.5 % (ref 36.0–46.0)
Hemoglobin: 12.4 g/dL (ref 12.0–15.0)
Lymphocytes Relative: 17 %
Lymphs Abs: 1.1 10*3/uL (ref 0.7–4.0)
MCH: 29 pg (ref 26.0–34.0)
MCHC: 33.1 g/dL (ref 30.0–36.0)
MCV: 87.6 fL (ref 78.0–100.0)
MONO ABS: 0.7 10*3/uL (ref 0.1–1.0)
MONOS PCT: 10 %
NEUTROS ABS: 4.4 10*3/uL (ref 1.7–7.7)
Neutrophils Relative %: 69 %
PLATELETS: 182 10*3/uL (ref 150–400)
RBC: 4.28 MIL/uL (ref 3.87–5.11)
RDW: 13.6 % (ref 11.5–15.5)
WBC: 6.4 10*3/uL (ref 4.0–10.5)

## 2017-01-30 LAB — BASIC METABOLIC PANEL
ANION GAP: 9 (ref 5–15)
BUN: 9 mg/dL (ref 6–20)
CO2: 23 mmol/L (ref 22–32)
Calcium: 8.7 mg/dL — ABNORMAL LOW (ref 8.9–10.3)
Chloride: 106 mmol/L (ref 101–111)
Creatinine, Ser: 0.64 mg/dL (ref 0.44–1.00)
GFR calc Af Amer: >60 mL/min (ref >60)
GLUCOSE: 104 mg/dL — AB (ref 65–99)
POTASSIUM: 3.6 mmol/L (ref 3.5–5.1)
Sodium: 138 mmol/L (ref 135–145)

## 2017-01-30 LAB — PROCALCITONIN

## 2017-01-30 MED ORDER — AMOXICILLIN-POT CLAVULANATE 875-125 MG PO TABS: 1.0000 | ORAL_TABLET | Freq: Two times a day (BID) | ORAL | 0 refills | Status: DC

## 2017-01-30 MED ORDER — IBUPROFEN 200 MG PO TABS
200.0000 mg | ORAL_TABLET | Freq: Once | ORAL | Status: AC
  Administered 2017-01-30: 200 mg via ORAL
  Filled 2017-01-30: qty 1

## 2017-01-30 MED ORDER — AMOXICILLIN-POT CLAVULANATE 875-125 MG PO TABS: 1.0000 | ORAL_TABLET | Freq: Two times a day (BID) | ORAL | 0 refills | Status: AC

## 2017-01-30 MED ORDER — IBUPROFEN 200 MG PO TABS: 200.0000 mg | ORAL_TABLET | Freq: Four times a day (QID) | ORAL | 0 refills | Status: DC | PRN

## 2017-01-30 NOTE — NC FL2 (Signed)
  [place] MEDICAID FL2 LEVEL OF CARE SCREENING TOOL     IDENTIFICATION  Patient Name: Adriana Torres Birthdate: [birth year] Sex: female Admission Date (Current Location): 01/28/2017  [facility] and [place] Number:  Producer, television/film/video and Address:  [facility], [address] [place], [place] [address]      Provider Number: [id number]  Attending Physician Name and Address:  Eddie North, MD  Relative Name and Phone Number:       Current Level of Care: Hospital Recommended Level of Care:   Prior Approval Number:    Date Approved/Denied:   PASRR Number:    Discharge Plan:  (Memory Care)    Current Diagnoses: Patient Active Problem List   Diagnosis Date Noted  . Syncope, vasovagal 01/28/2017  . HCAP (healthcare-associated pneumonia) 01/28/2017  . Fall     Orientation RESPIRATION BLADDER Height & Weight     Self  Normal (Room air) Incontinent Weight: 165 lb (74.8 kg) Height:   (172.7 cm)  BEHAVIORAL SYMPTOMS/MOOD NEUROLOGICAL BOWEL NUTRITION STATUS      Continent Diet (Heart Healthy)  AMBULATORY STATUS COMMUNICATION OF NEEDS Skin   Supervision Verbally  (Rash; groin, shoulder)                       Personal Care Assistance Level of Assistance  Bathing, Dressing Bathing Assistance: Limited assistance   Dressing Assistance: Limited assistance     Functional Limitations Info             SPECIAL CARE FACTORS FREQUENCY  Blood pressure Blood Pressure Frequency: Twice a day                  Contractures      Additional Factors Info  Code Status, Allergies Code Status Info: DNR Allergies Info: Tramadol           Current Medications (01/30/2017):  This is the current hospital active medication list Current Facility-Administered Medications  Medication Dose Route Frequency Provider Last Rate Last Dose  . acetaminophen (TYLENOL) tablet 650 mg  650 mg Oral Q6H PRN Richarda Overlie, MD   650 mg at 01/30/17 0508   Or   . acetaminophen (TYLENOL) suppository 650 mg  650 mg Rectal Q6H PRN Richarda Overlie, MD      . ceFEPIme (MAXIPIME) 1 g in dextrose 5 % 50 mL IVPB  1 g Intravenous Q24H Rachel L Rumbarger, RPH   1 g at 01/29/17 1413  . enoxaparin (LOVENOX) injection 40 mg  40 mg Subcutaneous Q24H Richarda Overlie, MD   40 mg at 01/29/17 1558  . levalbuterol (XOPENEX) nebulizer solution 0.63 mg  0.63 mg Nebulization BID Richarda Overlie, MD   0.63 mg at 01/30/17 0748  . MEDLINE mouth rinse  15 mL Mouth Rinse BID Richarda Overlie, MD   15 mL at 01/29/17 0950  . ondansetron (ZOFRAN) tablet 4 mg  4 mg Oral Q6H PRN Richarda Overlie, MD       Or  . ondansetron (ZOFRAN) injection 4 mg  4 mg Intravenous Q6H PRN Richarda Overlie, MD      . sodium chloride flush (NS) 0.9 % injection 3 mL  3 mL Intravenous Q12H Richarda Overlie, MD   3 mL at 01/29/17 0949     Discharge Medications: Please see discharge summary for a list of discharge medications.  Relevant Imaging Results:  Relevant Lab Results:   Additional Information [id number]  Dorothe Pea Drayson Dorko, LCSWA

## 2017-01-30 NOTE — Progress Notes (Addendum)
Pt to be discharged to [facility] to live at discharge.    Pt has been living independently prior to being admitted to [facility].  Pt is medically stable for D/C/return to [facility].    RN will call and arrange EMS for transport with [facility] at [phone].    Per coordinator at [facility] pt will go to room 208.  Number for report: [phone]    CSW sent D/C summary to [facility].  CSW Dept made two attempts to call the pt's daughters to make them aware of the D/C plan and left VM asking for a return call.  CSW reached daughter Ivyrose Hashman husband who said he will inform his wife Fannie Knee, the pt's daughter.  Please reconsult with CSW in future if needed.   Dorothe Pea. Jeryl Wilbourn, LCSWA, LCAS

## 2017-01-30 NOTE — Progress Notes (Signed)
CSW was advised by RN that patient is medically stable and ready for discharge. CSW attempted to assess patient however patient is only oriented to self. No family at the bedside. CSW attempted to get in contact with patient's daughter's Fannie Knee [phone]) and Dennie Bible [phone]) however received no answer. CSW was informed by RN that patient is from [facility]. CSW to contact facility to update.   CSW contacted facility to obtain additional information. Per Representative, patient is from their facility is able to return back once medically stable and ready for discharge. CSW informed representative that patient is medically cleared. Facility is ready for the patient to return back at anytime on today. CSW to arrange transportation back to the facility.   CSW will attempt to get in contact with patient's daughters to inform of patient transport on today. Patient to be transported via [facility] back to [facility]. Clinicals to be faxed to the agency. No other needs to report at this time.   Fernande Boyden, LCSWA Clinical Social Worker [facility] Ph: [phone]

## 2017-01-30 NOTE — Discharge Summary (Addendum)
Physician Discharge Summary  Timya Trimmer ZOX:[id number] DOB: 29-Nov-1918 DOA: 01/28/2017  PCP: Nadara Eaton, MD  Admit date: 01/28/2017 Discharge date: 01/30/2017  Admitted From: memory care unit Disposition:  Memory care unit  Recommendations for Outpatient Follow-up:  1. Follow up with PCP in 1-2 weeks 2. patient will complete total 5 days of antibiotics for pneumonia on 4/11 3. Please address long term need of keflex for ? UTI  Home Health:none Equipment/Devices:none  Discharge Condition:fair CODE STATUS:DNR Diet recommendation: regular    Discharge Diagnoses:  Principal Problem:   Syncope, vasovagal   Active Problems:   HCAP (healthcare-associated pneumonia)   Severe dementia   Recurrent UTI  Brief narrative/ HPI 83 year old female with a history of dementia, skin cancer, osteoporosis, resident of a memory care unit presented with fall and altered mental status. She was admitted with probable pneumonia. She has tolerated diet per speech therapy recommendations.  Assessment & Plan:   Fall:  mechanical vs vasovagal syncope. Stable on telemetry. CT head and cervicals pine on admission were negative. Seen by PT who recommends follow up at memory care.  Conservative management per family so no further w/up done.   Possible healthcare associated pneumonia:  repeat cxr showing pneumonia vs atelectasis.  Placed on empiric abx. Remains afebrile. transition ot oral Augmentin to complete 5 day course.    Leukocytosis:  improved; reactive vs due to pneumonia   Altered mental status:  Improved to baseline.  severe Dementia  hitory of Recurrent UTI:  was on bactrim prior to admission and has been discontinued. Renal function stable. UA negative pt home meds listed for being on chronic cephalexin for UTI prevention. Will recommend PCP  to address this.  Mild transaminitis:  Mildly elevated AST. Follow as outpt  Consults: none  Procedures: CT head and cervical  spine   Family Communication: none at bedside today.  Disposition Plan: DC to Memory care unit    Discharge Instructions   Allergies as of 01/30/2017      Reactions   Tramadol Other (See Comments)   Syncope      Medication List    STOP taking these medications   sulfamethoxazole-trimethoprim 800-160 MG tablet Commonly known as:  BACTRIM DS,SEPTRA DS   traMADol 50 MG tablet Commonly known as:  ULTRAM     TAKE these medications   amoxicillin-clavulanate 875-125 MG tablet Commonly known as:  AUGMENTIN Take 1 tablet by mouth 2 (two) times daily.   cephALEXin 250 MG capsule Commonly known as:  KEFLEX Take 250 mg by mouth 4 (four) times daily. UTI Prevention   CITRACAL PETITES/VITAMIN D 200-250 MG-UNIT Tabs Generic drug:  Calcium Citrate-Vitamin D Take 2 tablets by mouth 2 (two) times daily.   furosemide 20 MG tablet Commonly known as:  LASIX Take 10 mg by mouth.   naproxen sodium 220 MG tablet Commonly known as:  ANAPROX Take 220 mg by mouth 2 (two) times daily with a meal.       Allergies  Allergen Reactions  . Tramadol Other (See Comments)    Syncope      Procedures/Studies: Dg Pelvis 1-2 Views  Result Date: 01/28/2017 CLINICAL DATA:  Status post fall. EXAM: PELVIS - 1-2 VIEW COMPARISON:  None. FINDINGS: There is no evidence of hip fracture. Slight irregularity of the inferior ramus of the right pelvis may represent an age-indeterminate fracture. No evidence of pelvic diastases. No evidence of suspicious osseous lesions. Moderate amount of formed stool throughout the colon suggestive of constipation. IMPRESSION: No  evidence of hip fractures. Well corticated irregularity of the inferior right pubic ramus may represent an age-indeterminate fracture, favor chronic. Electronically Signed   By: Ted Mcalpine M.D.   On: 01/28/2017 10:09   Ct Head Wo Contrast  Result Date: 01/28/2017 CLINICAL DATA:  Fall, left head pain/injury, altered mental status EXAM: CT  HEAD WITHOUT CONTRAST CT CERVICAL SPINE WITHOUT CONTRAST TECHNIQUE: Multidetector CT imaging of the head and cervical spine was performed following the standard protocol without intravenous contrast. Multiplanar CT image reconstructions of the cervical spine were also generated. COMPARISON:  CT cervical spine dated 02/17/2015 FINDINGS: CT HEAD FINDINGS Brain: No evidence of acute infarction, hemorrhage, hydrocephalus, extra-axial collection or mass lesion/mass effect. Subcortical white matter and periventricular small vessel ischemic changes. Global cortical and central atrophy. Secondary ventricular prominence. Vascular: Mild intracranial atherosclerosis. Skull: Normal. Negative for fracture or focal lesion. Sinuses/Orbits: The visualized paranasal sinuses are essentially clear. The mastoid air cells are unopacified. Other: None. CT CERVICAL SPINE FINDINGS Alignment: Normal cervical lordosis. Skull base and vertebrae: No acute fracture. No primary bone lesion or focal pathologic process. Soft tissues and spinal canal: No prevertebral soft tissue swelling. Spinal canal is widely patent. Disc levels:  Mild multilevel degenerative changes. Upper chest: Visualized lung apices are clear. Other: Visualized thyroid is mildly heterogeneous/nodular. IMPRESSION: No evidence of acute intracranial abnormality. Atrophy with small vessel ischemic changes. No evidence of traumatic injury to the cervical spine. Mild multilevel degenerative changes. Electronically Signed   By: Charline Bills M.D.   On: 01/28/2017 10:21   Ct Cervical Spine Wo Contrast  Result Date: 01/28/2017 CLINICAL DATA:  Fall, left head pain/injury, altered mental status EXAM: CT HEAD WITHOUT CONTRAST CT CERVICAL SPINE WITHOUT CONTRAST TECHNIQUE: Multidetector CT imaging of the head and cervical spine was performed following the standard protocol without intravenous contrast. Multiplanar CT image reconstructions of the cervical spine were also generated.  COMPARISON:  CT cervical spine dated 02/17/2015 FINDINGS: CT HEAD FINDINGS Brain: No evidence of acute infarction, hemorrhage, hydrocephalus, extra-axial collection or mass lesion/mass effect. Subcortical white matter and periventricular small vessel ischemic changes. Global cortical and central atrophy. Secondary ventricular prominence. Vascular: Mild intracranial atherosclerosis. Skull: Normal. Negative for fracture or focal lesion. Sinuses/Orbits: The visualized paranasal sinuses are essentially clear. The mastoid air cells are unopacified. Other: None. CT CERVICAL SPINE FINDINGS Alignment: Normal cervical lordosis. Skull base and vertebrae: No acute fracture. No primary bone lesion or focal pathologic process. Soft tissues and spinal canal: No prevertebral soft tissue swelling. Spinal canal is widely patent. Disc levels:  Mild multilevel degenerative changes. Upper chest: Visualized lung apices are clear. Other: Visualized thyroid is mildly heterogeneous/nodular. IMPRESSION: No evidence of acute intracranial abnormality. Atrophy with small vessel ischemic changes. No evidence of traumatic injury to the cervical spine. Mild multilevel degenerative changes. Electronically Signed   By: Charline Bills M.D.   On: 01/28/2017 10:21   Dg Chest Port 1 View  Result Date: 01/29/2017 CLINICAL DATA:  Syncope.  History of pneumonia EXAM: PORTABLE CHEST 1 VIEW COMPARISON:  Yesterday FINDINGS: Cardiomegaly which is accentuated by rotation, as is the upper mediastinal widening. Low volume chest with interstitial coarsening. There is interstitial crowding and hazy density at the bases. No Kerley lines, effusion, or pneumothorax. IMPRESSION: Limited low volume chest that is stable from yesterday. Pneumonia or atelectasis may be present at the bases. Electronically Signed   By: Marnee Spring M.D.   On: 01/29/2017 08:18   Dg Chest Portable 1 View  Result Date: 01/28/2017 CLINICAL  DATA:  Fall.  Altered mental status. EXAM:  PORTABLE CHEST 1 VIEW COMPARISON:  01/23/2015 . FINDINGS: Stable cardiomegaly. Mild right perihilar infiltrate cannot be excluded. Low lung volumes with basilar atelectasis. No pleural effusion or pneumothorax. IMPRESSION: 1. Mild right perihilar infiltrate cannot be excluded. Low lung volumes with mild bibasilar atelectasis. 2. Stable cardiomegaly. Electronically Signed   By: Maisie Fus  Register   On: 01/28/2017 09:30   Dg Shoulder Left  Result Date: 01/28/2017 CLINICAL DATA:  Fall. EXAM: LEFT SHOULDER - 2+ VIEW COMPARISON:  Chest x-ray 01/28/2017. FINDINGS: Acromioclavicular and glenohumeral degenerative change. Diffuse osteopenia. No evidence of fracture, dislocation, or separation. IMPRESSION: No acute abnormality . Electronically Signed   By: Maisie Fus  Register   On: 01/28/2017 13:06       Subjective: No overnight issues  Discharge Exam: Vitals:   01/29/17 1944 01/30/17 0506  BP: (!) 156/73 (!) 144/75  Pulse: 88 83  Resp: 20 16  Temp: 99.1 F (37.3 C) 97.9 F (36.6 C)   Vitals:   01/29/17 1427 01/29/17 1944 01/30/17 0506 01/30/17 0749  BP: 137/62 (!) 156/73 (!) 144/75   Pulse: 75 88 83   Resp: Temp: 98.1 F (36.7 C) 99.1 F (37.3 C) 97.9 F (36.6 C)   TempSrc: Oral Oral Axillary   SpO2: 97% 93% 92% 93%  Weight:      Height:        General: elderly female in NAD HEENT: moist mucosa, supple neck chest: CTA bilaterally, no wheezing, no rhonchi CVS: NS1&S2, no murmurs GI: Soft, NT, ND, bowel sounds + musculoskeletal: no edema, CNS: alert and awake, not oriented    The results of significant diagnostics from this hospitalization (including imaging, microbiology, ancillary and laboratory) are listed below for reference.     Microbiology: Recent Results (from the past 240 hour(s))  Culture, blood (routine x 2) Call MD if unable to obtain prior to antibiotics being given     Status: None (Preliminary result)   Collection Time: 01/28/17  1:35 PM  Result Value  Ref Range Status   Specimen Description BLOOD RIGHT ANTECUBITAL  Final   Special Requests   Final    BOTTLES DRAWN AEROBIC AND ANAEROBIC Blood Culture adequate volume   Culture NO GROWTH 1 DAY  Final   Report Status PENDING  Incomplete  Culture, blood (routine x 2) Call MD if unable to obtain prior to antibiotics being given     Status: None (Preliminary result)   Collection Time: 01/28/17  1:49 PM  Result Value Ref Range Status   Specimen Description BLOOD RIGHT HAND  Final   Special Requests AEROBIC BOTTLE ONLY Blood Culture adequate volume  Final   Culture NO GROWTH 1 DAY  Final   Report Status PENDING  Incomplete  MRSA PCR Screening     Status: None   Collection Time: 01/28/17  8:03 PM  Result Value Ref Range Status   MRSA by PCR NEGATIVE NEGATIVE Final    Comment:        The GeneXpert MRSA Assay (FDA approved for NASAL specimens only), is one component of a comprehensive MRSA colonization surveillance program. It is not intended to diagnose MRSA infection nor to guide or monitor treatment for MRSA infections.      Labs: BNP (last 3 results) No results for input(s): BNP in the last 8760 hours. Basic Metabolic Panel:  Recent Labs Lab 01/28/17 0923 01/28/17 1358 01/29/17 0028 01/30/17 0531  NA 137  --  138  138  K 4.2  --  3.9 3.6  CL 106  --  108 106  CO2 20*  --  20* 23  GLUCOSE 144*  --  119* 104*  BUN 13  --  13 9  CREATININE 0.89 0.77 0.79 0.64  CALCIUM 9.1  --  8.6* 8.7*  MG  --  1.9  --   --    Liver Function Tests:  Recent Labs Lab 01/28/17 0923 01/29/17 0028  AST 43* 46*  ALT 25 24  ALKPHOS 77 57  BILITOT 0.4 1.0  PROT 8.2* 6.6  ALBUMIN 3.8 3.3*   No results for input(s): LIPASE, AMYLASE in the last 168 hours.  Recent Labs Lab 01/28/17 0923  AMMONIA 15   CBC:  Recent Labs Lab 01/28/17 0923 01/28/17 1358 01/29/17 0028 01/30/17 0531  WBC 12.8* 11.5* 8.5 6.4  NEUTROABS 11.6*  --   --  4.4  HGB 13.9 13.4 12.0 12.4  HCT 42.1 40.0  36.9 37.5  MCV 88.1 87.1 87.9 87.6  PLT 195 214 203 182   Cardiac Enzymes:  Recent Labs Lab 01/28/17 1358 01/28/17 1748 01/29/17 0028  TROPONINI <0.03 <0.03 <0.03   BNP: Invalid input(s): POCBNP CBG: No results for input(s): GLUCAP in the last 168 hours. D-Dimer No results for input(s): DDIMER in the last 72 hours. Hgb A1c No results for input(s): HGBA1C in the last 72 hours. Lipid Profile No results for input(s): CHOL, HDL, LDLCALC, TRIG, CHOLHDL, LDLDIRECT in the last 72 hours. Thyroid function studies  Recent Labs  01/28/17 1358  TSH 1.192   Anemia work up No results for input(s): VITAMINB12, FOLATE, FERRITIN, TIBC, IRON, RETICCTPCT in the last 72 hours. Urinalysis    Component Value Date/Time   COLORURINE YELLOW 01/28/2017 1108   APPEARANCEUR CLEAR 01/28/2017 1108   LABSPEC 1.015 01/28/2017 1108   PHURINE 7.0 01/28/2017 1108   GLUCOSEU NEGATIVE 01/28/2017 1108   HGBUR SMALL (A) 01/28/2017 1108   BILIRUBINUR NEGATIVE 01/28/2017 1108   KETONESUR NEGATIVE 01/28/2017 1108   PROTEINUR NEGATIVE 01/28/2017 1108   NITRITE NEGATIVE 01/28/2017 1108   LEUKOCYTESUR NEGATIVE 01/28/2017 1108   Sepsis Labs Invalid input(s): PROCALCITONIN,  WBC,  LACTICIDVEN Microbiology Recent Results (from the past 240 hour(s))  Culture, blood (routine x 2) Call MD if unable to obtain prior to antibiotics being given     Status: None (Preliminary result)   Collection Time: 01/28/17  1:35 PM  Result Value Ref Range Status   Specimen Description BLOOD RIGHT ANTECUBITAL  Final   Special Requests   Final    BOTTLES DRAWN AEROBIC AND ANAEROBIC Blood Culture adequate volume   Culture NO GROWTH 1 DAY  Final   Report Status PENDING  Incomplete  Culture, blood (routine x 2) Call MD if unable to obtain prior to antibiotics being given     Status: None (Preliminary result)   Collection Time: 01/28/17  1:49 PM  Result Value Ref Range Status   Specimen Description BLOOD RIGHT HAND  Final    Special Requests AEROBIC BOTTLE ONLY Blood Culture adequate volume  Final   Culture NO GROWTH 1 DAY  Final   Report Status PENDING  Incomplete  MRSA PCR Screening     Status: None   Collection Time: 01/28/17  8:03 PM  Result Value Ref Range Status   MRSA by PCR NEGATIVE NEGATIVE Final    Comment:        The GeneXpert MRSA Assay (FDA approved for NASAL specimens only), is one component  of a comprehensive MRSA colonization surveillance program. It is not intended to diagnose MRSA infection nor to guide or monitor treatment for MRSA infections.      Time coordinating discharge: Over 30 minutes  SIGNED:   Eddie North, MD  [facility] 01/30/2017, 10:12 AM Pager   If 7PM-7AM, please contact night-coverage [email or web address] Password [email or web address]

## 2017-01-30 NOTE — Progress Notes (Addendum)
Patient in a stable condition, report called to nurse at [facility], discharge education reviewed with patient's daughters at bedside, they verbalised understanding, patient belongings at bedside, printed AVS and prescriptions given to family, patient daughters to transport patient to [facility], they refused [facility].

## 2017-01-31 ENCOUNTER — Encounter (HOSPITAL_COMMUNITY): Payer: Self-pay | Admitting: Emergency Medicine

## 2017-01-31 ENCOUNTER — Emergency Department (HOSPITAL_COMMUNITY)
Admission: EM | Admit: 2017-01-31 | Discharge: 2017-01-31 | Disposition: A | Discharge status: Home / Self Care | Payer: Medicare Other | Attending: Emergency Medicine | Admitting: Emergency Medicine

## 2017-01-31 ENCOUNTER — Emergency Department (HOSPITAL_COMMUNITY): Payer: Medicare Other

## 2017-01-31 DIAGNOSIS — Z79899 Other long term (current) drug therapy: Secondary | ICD-10-CM | POA: Insufficient documentation

## 2017-01-31 DIAGNOSIS — R4182 Altered mental status, unspecified: Secondary | ICD-10-CM

## 2017-01-31 DIAGNOSIS — G8929 Other chronic pain: Secondary | ICD-10-CM | POA: Insufficient documentation

## 2017-01-31 DIAGNOSIS — R531 Weakness: Secondary | ICD-10-CM | POA: Diagnosis not present

## 2017-01-31 DIAGNOSIS — F0390 Unspecified dementia without behavioral disturbance: Secondary | ICD-10-CM | POA: Diagnosis not present

## 2017-01-31 DIAGNOSIS — R5383 Other fatigue: Secondary | ICD-10-CM | POA: Insufficient documentation

## 2017-01-31 LAB — URINALYSIS, ROUTINE W REFLEX MICROSCOPIC
Bilirubin Urine: NEGATIVE
Glucose, UA: NEGATIVE mg/dL
Hgb urine dipstick: NEGATIVE
Ketones, ur: 5 mg/dL — AB
LEUKOCYTES UA: NEGATIVE
Nitrite: NEGATIVE
PROTEIN: NEGATIVE mg/dL
SPECIFIC GRAVITY, URINE: 1.012 (ref 1.005–1.030)
pH: 6 (ref 5.0–8.0)

## 2017-01-31 LAB — CBC
HCT: 41.8 % (ref 36.0–46.0)
Hemoglobin: 13.7 g/dL (ref 12.0–15.0)
MCH: 28.9 pg (ref 26.0–34.0)
MCHC: 32.8 g/dL (ref 30.0–36.0)
MCV: 88.2 fL (ref 78.0–100.0)
PLATELETS: 201 10*3/uL (ref 150–400)
RBC: 4.74 MIL/uL (ref 3.87–5.11)
RDW: 13.8 % (ref 11.5–15.5)
WBC: 6.8 10*3/uL (ref 4.0–10.5)

## 2017-01-31 LAB — COMPREHENSIVE METABOLIC PANEL
ALBUMIN: 3.5 g/dL (ref 3.5–5.0)
ALK PHOS: 64 U/L (ref 38–126)
ALT: 36 U/L (ref 14–54)
ANION GAP: 9 (ref 5–15)
AST: 63 U/L — ABNORMAL HIGH (ref 15–41)
BILIRUBIN TOTAL: 0.9 mg/dL (ref 0.3–1.2)
BUN: 12 mg/dL (ref 6–20)
CALCIUM: 9.1 mg/dL (ref 8.9–10.3)
CO2: 24 mmol/L (ref 22–32)
CREATININE: 0.74 mg/dL (ref 0.44–1.00)
Chloride: 106 mmol/L (ref 101–111)
GFR calc non Af Amer: >60 mL/min (ref >60)
Glucose, Bld: 109 mg/dL — ABNORMAL HIGH (ref 65–99)
Potassium: 4.3 mmol/L (ref 3.5–5.1)
Sodium: 139 mmol/L (ref 135–145)
Total Protein: 7.4 g/dL (ref 6.5–8.1)

## 2017-01-31 NOTE — ED Provider Notes (Addendum)
[facility]-EMERGENCY DEPT Provider Note   CSN: [id number] Arrival date & time: 01/31/17  1320     History   Chief Complaint Chief Complaint  Patient presents with  . Altered Mental Status    HPI Adriana Torres is a 83 y.o. female.  Patient with hx advanced dementia, presents from [facility] via EMS due to noted to be sleeping a lot.  Patient recently admitted post fall - CT imaging then negative acute.  Was dx w possible pna.  Pt's family does note that in past year pts mental status has continued to decline and that in general she was sleeping more than previous.  No new meds or sedating meds. No recurrent trauma or fall. No reported fevers. No vomiting. Patient is limited historian - level 5 caveat. She is resting but easily aroused, and denies pain or other c/o.    The history is provided by the patient, a relative and the EMS personnel. The history is limited by the condition of the patient.  Altered Mental Status      Past Medical History:  Diagnosis Date  . Pneumonia     Patient Active Problem List   Diagnosis Date Noted  . Severe dementia 01/30/2017  . Syncope, vasovagal 01/28/2017  . HCAP (healthcare-associated pneumonia) 01/28/2017  . Fall     History reviewed. No pertinent surgical history.  OB History    No data available       Home Medications    Prior to Admission medications   Medication Sig Start Date End Date Taking? Authorizing Provider  acetaminophen (TYLENOL) 325 MG tablet Take 650 mg by mouth every 4 (four) hours as needed for mild pain.   Yes Historical Provider, MD  amoxicillin (AMOXIL) 875 MG tablet Take 875 mg by mouth 2 (two) times daily.   Yes Historical Provider, MD  Calcium Citrate-Vitamin D (CITRACAL PETITES/VITAMIN D) 200-250 MG-UNIT TABS Take 2 tablets by mouth 2 (two) times daily.   Yes Historical Provider, MD  cephALEXin (KEFLEX) 250 MG capsule Take 250 mg by mouth 4 (four) times daily. UTI Prevention   Yes Historical Provider, MD  furosemide  (LASIX) 20 MG tablet Take 10 mg by mouth daily.    Yes Historical Provider, MD  loperamide (IMODIUM A-D) 2 MG tablet Take 2 mg by mouth 4 (four) times daily as needed for diarrhea or loose stools.   Yes Historical Provider, MD  naproxen sodium (ANAPROX) 220 MG tablet Take 220 mg by mouth 2 (two) times daily with a meal.   Yes Historical Provider, MD  nystatin (NYSTATIN) powder Apply 1 g topically 3 (three) times daily as needed (rash).   Yes Historical Provider, MD  ondansetron (ZOFRAN) 4 MG tablet Take 4 mg by mouth every 8 (eight) hours as needed for nausea or vomiting.   Yes Historical Provider, MD  sodium chloride (OCEAN) 0.65 % SOLN nasal spray Place 1 spray into both nostrils 2 (two) times daily as needed for congestion.   Yes Historical Provider, MD  amoxicillin-clavulanate (AUGMENTIN) 875-125 MG tablet Take 1 tablet by mouth 2 (two) times daily. Patient not taking: Reported on 01/31/2017 01/30/17 02/02/17  Nishant Dhungel, MD  ibuprofen (MOTRIN IB) 200 MG tablet Take 1 tablet (200 mg total) by mouth every 6 (six) hours as needed for moderate pain. 01/30/17   Nishant Dhungel, MD    Family History No family history on file.  Social History Social History  Substance Use Topics  . Smoking status: Never Smoker  . Smokeless tobacco: Never  Used  . Alcohol use No     Allergies   Tramadol   Review of Systems Review of Systems  Unable to perform ROS: Mental status change  level 5 caveat - dementia.    Physical Exam Updated Vital Signs BP 114/73   Pulse 87   Temp 97.7 F (36.5 C) (Oral)   Resp 13   SpO2 97%   Physical Exam  Constitutional: She appears well-developed and well-nourished. No distress.  HENT:  Head: Atraumatic.  Mouth/Throat: Oropharynx is clear and moist.  Eyes: Conjunctivae are normal. Pupils are equal, round, and reactive to light. No scleral icterus.  Neck: Neck supple. No tracheal deviation present.  No stiffness or rigidity  Cardiovascular: Normal rate,  regular rhythm, normal heart sounds and intact distal pulses.   Pulmonary/Chest: Effort normal and breath sounds normal. No respiratory distress.  Abdominal: Soft. Normal appearance and bowel sounds are normal. She exhibits no distension. There is no tenderness.  Genitourinary:  Genitourinary Comments: No cva tenderness  Musculoskeletal: She exhibits no edema.  Neurological: She is alert.  Resting quietly. Easily aroused. Speech quiet but clear. No facial droops. Motor grossly intact bil, equal grips.   Skin: Skin is warm and dry. No rash noted.  Psychiatric: She has a normal mood and affect.  Nursing note and vitals reviewed.    ED Treatments / Results  Labs (all labs ordered are listed, but only abnormal results are displayed) Results for orders placed or performed during the hospital encounter of 01/31/17  Comprehensive metabolic panel  Result Value Ref Range   Sodium 139 135 - 145 mmol/L   Potassium 4.3 3.5 - 5.1 mmol/L   Chloride 106 101 - 111 mmol/L   CO2 24 22 - 32 mmol/L   Glucose, Bld 109 (H) 65 - 99 mg/dL   BUN 12 6 - 20 mg/dL   Creatinine, Ser 4.09 0.44 - 1.00 mg/dL   Calcium 9.1 8.9 - 81.1 mg/dL   Total Protein 7.4 6.5 - 8.1 g/dL   Albumin 3.5 3.5 - 5.0 g/dL   AST 63 (H) 15 - 41 U/L   ALT 36 14 - 54 U/L   Alkaline Phosphatase 64 38 - 126 U/L   Total Bilirubin 0.9 0.3 - 1.2 mg/dL   GFR calc non Af Amer >60 >60 mL/min   GFR calc Af Amer >60 >60 mL/min   Anion gap 9 5 - 15  CBC  Result Value Ref Range   WBC 6.8 4.0 - 10.5 K/uL   RBC 4.74 3.87 - 5.11 MIL/uL   Hemoglobin 13.7 12.0 - 15.0 g/dL   HCT 91.4 78.2 - 95.6 %   MCV 88.2 78.0 - 100.0 fL   MCH 28.9 26.0 - 34.0 pg   MCHC 32.8 30.0 - 36.0 g/dL   RDW 21.3 08.6 - 57.8 %   Platelets 201 150 - 400 K/uL     Dg Chest Port 1 View  Result Date: 01/31/2017 CLINICAL DATA:  Altered mental status. EXAM: PORTABLE CHEST 1 VIEW COMPARISON:  01/29/2017 FINDINGS: 1454 hours. Lungs are hyperexpanded. The cardio  pericardial silhouette is enlarged. Interstitial markings are diffusely coarsened with chronic features. The lungs are clear wiithout focal pneumonia, edema, pneumothorax or pleural effusion. The visualized bony structures of the thorax are intact. Telemetry leads overlie the chest. IMPRESSION: No acute cardiopulmonary findings. Electronically Signed   By: Kennith Center M.D.   On: 01/31/2017 15:06      EKG  EKG Interpretation None  Radiology Dg Chest Port 1 View  Result Date: 01/31/2017 CLINICAL DATA:  Altered mental status. EXAM: PORTABLE CHEST 1 VIEW COMPARISON:  01/29/2017 FINDINGS: 1454 hours. Lungs are hyperexpanded. The cardio pericardial silhouette is enlarged. Interstitial markings are diffusely coarsened with chronic features. The lungs are clear wiithout focal pneumonia, edema, pneumothorax or pleural effusion. The visualized bony structures of the thorax are intact. Telemetry leads overlie the chest. IMPRESSION: No acute cardiopulmonary findings. Electronically Signed   By: Kennith Center M.D.   On: 01/31/2017 15:06    Procedures Procedures (including critical care time)  Medications Ordered in ED Medications - No data to display   Initial Impression / Assessment and Plan / ED Course  I have reviewed the triage vital signs and the nursing notes.  Pertinent labs & imaging results that were available during my care of the patient were reviewed by me and considered in my medical decision making (see chart for details).  Reviewed nursing notes and prior charts for additional history.   Prior imaging not convincing for significant pna.   Recent head ct negative acute.  Pt has non focal neuro exam.  Is easily aroused and denies pain or other c/o.   UA is pending, rn to get cath ua.  If ua negative, feel pt will be stable for return to [facility].  1610 - signed out to Dr Jeraldine Loots  Final Clinical Impressions(s) / ED Diagnoses   Final diagnoses:  None    New  Prescriptions New Prescriptions   No medications on file        Cathren Laine, MD 01/31/17 1612

## 2017-01-31 NOTE — Discharge Instructions (Signed)
It was our pleasure to provide your ER care today - we hope that you feel better.  Encourage patient to drink adequate fluids.  Follow up with primary care doctor in the next few days.  Return to ER if worse, new symptoms, fevers, trouble breathing, new or severe pain, other concern.

## 2017-01-31 NOTE — ED Provider Notes (Signed)
Patient calm, sleeping.  All results discussed with both of her (twin) daughters.  Patient will return to NH, f/u w PMD.   Gerhard Munch, MD 01/31/17 1900

## 2017-01-31 NOTE — ED Triage Notes (Signed)
Patient with history dementia from [facility] with [facility] for altered mental status.  Per [facility] patient found by family to be minimally responsive and called EMS.  Patient discharged yesterday after being admitted for a fall.  Patient responds to voice, does not answer questions.  20g in left AC.  Patient in no apparent distress at this time.

## 2017-02-02 LAB — CULTURE, BLOOD (ROUTINE X 2)
Culture: NO GROWTH
Culture: NO GROWTH
SPECIAL REQUESTS: ADEQUATE
Special Requests: ADEQUATE

## 2017-09-20 ENCOUNTER — Emergency Department (HOSPITAL_COMMUNITY): Payer: Medicare Other

## 2017-09-20 ENCOUNTER — Inpatient Hospital Stay (HOSPITAL_COMMUNITY)
Admission: EM | Admit: 2017-09-20 | Discharge: 2017-09-23 | DRG: 535 | Disposition: A | Discharge status: Skilled Nursing Facility | Payer: Medicare Other | Attending: Family Medicine | Admitting: Family Medicine

## 2017-09-20 ENCOUNTER — Encounter (HOSPITAL_COMMUNITY): Payer: Self-pay | Admitting: Nurse Practitioner

## 2017-09-20 ENCOUNTER — Other Ambulatory Visit: Payer: Self-pay

## 2017-09-20 DIAGNOSIS — Z66 Do not resuscitate: Secondary | ICD-10-CM | POA: Diagnosis present

## 2017-09-20 DIAGNOSIS — N390 Urinary tract infection, site not specified: Secondary | ICD-10-CM | POA: Diagnosis present

## 2017-09-20 DIAGNOSIS — Z792 Long term (current) use of antibiotics: Secondary | ICD-10-CM | POA: Diagnosis not present

## 2017-09-20 DIAGNOSIS — Z79899 Other long term (current) drug therapy: Secondary | ICD-10-CM | POA: Diagnosis not present

## 2017-09-20 DIAGNOSIS — S32592A Other specified fracture of left pubis, initial encounter for closed fracture: Principal | ICD-10-CM | POA: Diagnosis present

## 2017-09-20 DIAGNOSIS — W1830XA Fall on same level, unspecified, initial encounter: Secondary | ICD-10-CM | POA: Diagnosis present

## 2017-09-20 DIAGNOSIS — S32599A Other specified fracture of unspecified pubis, initial encounter for closed fracture: Secondary | ICD-10-CM | POA: Diagnosis present

## 2017-09-20 DIAGNOSIS — W19XXXA Unspecified fall, initial encounter: Secondary | ICD-10-CM | POA: Diagnosis present

## 2017-09-20 DIAGNOSIS — Z888 Allergy status to other drugs, medicaments and biological substances status: Secondary | ICD-10-CM | POA: Diagnosis not present

## 2017-09-20 DIAGNOSIS — Z885 Allergy status to narcotic agent status: Secondary | ICD-10-CM

## 2017-09-20 DIAGNOSIS — Y92129 Unspecified place in nursing home as the place of occurrence of the external cause: Secondary | ICD-10-CM | POA: Diagnosis not present

## 2017-09-20 DIAGNOSIS — S32591A Other specified fracture of right pubis, initial encounter for closed fracture: Secondary | ICD-10-CM

## 2017-09-20 DIAGNOSIS — M62838 Other muscle spasm: Secondary | ICD-10-CM | POA: Diagnosis not present

## 2017-09-20 DIAGNOSIS — E876 Hypokalemia: Secondary | ICD-10-CM | POA: Diagnosis present

## 2017-09-20 DIAGNOSIS — M810 Age-related osteoporosis without current pathological fracture: Secondary | ICD-10-CM | POA: Diagnosis present

## 2017-09-20 DIAGNOSIS — R600 Localized edema: Secondary | ICD-10-CM | POA: Diagnosis present

## 2017-09-20 DIAGNOSIS — S32472A Displaced fracture of medial wall of left acetabulum, initial encounter for closed fracture: Secondary | ICD-10-CM | POA: Diagnosis present

## 2017-09-20 DIAGNOSIS — Z91013 Allergy to seafood: Secondary | ICD-10-CM | POA: Diagnosis not present

## 2017-09-20 DIAGNOSIS — F0390 Unspecified dementia without behavioral disturbance: Secondary | ICD-10-CM | POA: Diagnosis present

## 2017-09-20 DIAGNOSIS — F03C0 Unspecified dementia, severe, without behavioral disturbance, psychotic disturbance, mood disturbance, and anxiety: Secondary | ICD-10-CM | POA: Diagnosis present

## 2017-09-20 HISTORY — DX: Unspecified dementia, unspecified severity, without behavioral disturbance, psychotic disturbance, mood disturbance, and anxiety: F03.90

## 2017-09-20 LAB — CBC WITH DIFFERENTIAL/PLATELET
Basophils Absolute: 0 10*3/uL (ref 0.0–0.1)
Basophils Relative: 0 %
Eosinophils Absolute: 0 10*3/uL (ref 0.0–0.7)
Eosinophils Relative: 0 %
HCT: 37.6 % (ref 36.0–46.0)
Hemoglobin: 12.6 g/dL (ref 12.0–15.0)
LYMPHS ABS: 0.6 10*3/uL — AB (ref 0.7–4.0)
LYMPHS PCT: 4 %
MCH: 29.7 pg (ref 26.0–34.0)
MCHC: 33.5 g/dL (ref 30.0–36.0)
MCV: 88.7 fL (ref 78.0–100.0)
Monocytes Absolute: 0.6 10*3/uL (ref 0.1–1.0)
Monocytes Relative: 4 %
NEUTROS ABS: 12 10*3/uL — AB (ref 1.7–7.7)
NEUTROS PCT: 92 %
Platelets: 155 10*3/uL (ref 150–400)
RBC: 4.24 MIL/uL (ref 3.87–5.11)
RDW: 13.8 % (ref 11.5–15.5)
WBC: 13.1 10*3/uL — AB (ref 4.0–10.5)

## 2017-09-20 LAB — BASIC METABOLIC PANEL
Anion gap: 8 (ref 5–15)
BUN: 19 mg/dL (ref 6–20)
CHLORIDE: 106 mmol/L (ref 101–111)
CO2: 23 mmol/L (ref 22–32)
Calcium: 8.5 mg/dL — ABNORMAL LOW (ref 8.9–10.3)
Creatinine, Ser: 0.7 mg/dL (ref 0.44–1.00)
GFR calc non Af Amer: >60 mL/min (ref >60)
Glucose, Bld: 141 mg/dL — ABNORMAL HIGH (ref 65–99)
POTASSIUM: 3.4 mmol/L — AB (ref 3.5–5.1)
SODIUM: 137 mmol/L (ref 135–145)

## 2017-09-20 LAB — TYPE AND SCREEN
ABO/RH(D): O POS
Antibody Screen: NEGATIVE

## 2017-09-20 LAB — APTT: aPTT: 32 seconds (ref 24–36)

## 2017-09-20 LAB — URINALYSIS, ROUTINE W REFLEX MICROSCOPIC
BILIRUBIN URINE: NEGATIVE
Glucose, UA: NEGATIVE mg/dL
Hgb urine dipstick: NEGATIVE
KETONES UR: 80 mg/dL — AB
Nitrite: POSITIVE — AB
PROTEIN: NEGATIVE mg/dL
Specific Gravity, Urine: 1.018 (ref 1.005–1.030)
pH: 6 (ref 5.0–8.0)

## 2017-09-20 LAB — ABO/RH: ABO/RH(D): O POS

## 2017-09-20 LAB — PROTIME-INR
INR: 1.19
Prothrombin Time: 15 seconds (ref 11.4–15.2)

## 2017-09-20 MED ORDER — POTASSIUM CHLORIDE 20 MEQ/15ML (10%) PO SOLN: 20.0000 meq | Freq: Once | ORAL | Status: DC

## 2017-09-20 MED ORDER — FUROSEMIDE 20 MG PO TABS
10.0000 mg | ORAL_TABLET | Freq: Every day | ORAL | Status: DC
  Administered 2017-09-21: 20 mg via ORAL
  Administered 2017-09-22 – 2017-09-23 (×2): 10 mg via ORAL
  Filled 2017-09-20 (×3): qty 1

## 2017-09-20 MED ORDER — FENTANYL CITRATE (PF) 100 MCG/2ML IJ SOLN
12.5000 ug | INTRAMUSCULAR | Status: DC | PRN
  Filled 2017-09-20: qty 2

## 2017-09-20 MED ORDER — FENTANYL CITRATE (PF) 100 MCG/2ML IJ SOLN
25.0000 ug | Freq: Once | INTRAMUSCULAR | Status: AC
Start: 2017-09-20 — End: 2017-09-20
  Administered 2017-09-20: 25 ug via INTRAMUSCULAR
  Filled 2017-09-20: qty 2

## 2017-09-20 MED ORDER — ENOXAPARIN SODIUM 40 MG/0.4ML ~~LOC~~ SOLN
40.0000 mg | SUBCUTANEOUS | Status: DC
  Administered 2017-09-21 – 2017-09-22 (×3): 40 mg via SUBCUTANEOUS
  Filled 2017-09-20 (×3): qty 0.4

## 2017-09-20 MED ORDER — DEXTROSE 5 % IV SOLN
1.0000 g | Freq: Every day | INTRAVENOUS | Status: DC
  Administered 2017-09-21 – 2017-09-22 (×3): 1 g via INTRAVENOUS
  Filled 2017-09-20 (×3): qty 10

## 2017-09-20 MED ORDER — HYDROCODONE-ACETAMINOPHEN 5-325 MG PO TABS
1.0000 | ORAL_TABLET | ORAL | Status: DC | PRN
  Administered 2017-09-21 (×2): 1 via ORAL
  Filled 2017-09-20 (×2): qty 1

## 2017-09-20 MED ORDER — NAPROXEN SODIUM 275 MG PO TABS
275.0000 mg | ORAL_TABLET | Freq: Two times a day (BID) | ORAL | Status: DC
  Administered 2017-09-21 – 2017-09-22 (×4): 275 mg via ORAL
  Filled 2017-09-20 (×5): qty 1

## 2017-09-20 MED ORDER — METHOCARBAMOL 500 MG PO TABS: 500.0000 mg | ORAL_TABLET | Freq: Three times a day (TID) | ORAL | Status: DC | PRN

## 2017-09-20 MED ORDER — ONDANSETRON HCL 4 MG/2ML IJ SOLN: 4.0000 mg | Freq: Three times a day (TID) | INTRAMUSCULAR | Status: DC | PRN

## 2017-09-20 MED ORDER — ZOLPIDEM TARTRATE 5 MG PO TABS: 5.0000 mg | ORAL_TABLET | Freq: Every evening | ORAL | Status: DC | PRN

## 2017-09-20 MED ORDER — SODIUM CHLORIDE 0.9 % IV SOLN
INTRAVENOUS | Status: DC
  Administered 2017-09-21 – 2017-09-22 (×3): via INTRAVENOUS

## 2017-09-20 MED ORDER — ACETAMINOPHEN 325 MG PO TABS
650.0000 mg | ORAL_TABLET | Freq: Four times a day (QID) | ORAL | Status: DC | PRN
  Administered 2017-09-22 – 2017-09-23 (×2): 650 mg via ORAL
  Filled 2017-09-20: qty 2

## 2017-09-20 MED ORDER — POLYETHYLENE GLYCOL 3350 17 G PO PACK: 17.0000 g | PACK | Freq: Every day | ORAL | Status: DC | PRN

## 2017-09-20 NOTE — ED Notes (Signed)
Assigned room 1604 @2037  call consult@2057 

## 2017-09-20 NOTE — ED Notes (Signed)
Bed: [id number] Expected date:  Expected time:  Means of arrival:  Comments: Hold for HALL C

## 2017-09-20 NOTE — ED Notes (Signed)
Bed: WHALC Expected date:  Expected time:  Means of arrival:  Comments: EMS-fall-hip pain 

## 2017-09-20 NOTE — ED Provider Notes (Signed)
  Face-to-face evaluation   History: She is here for evaluation of injury from fall.  Fall was unwitnessed, but suspected to be mechanical.  She typically walks with a walker, but very slowly and is having increasing trouble doing that.  According to family members were with her, she typically gets shuttled around in a wheelchair.  She lives in a memory care unit.  Physical exam: Elderly patient who has been medicated and is sleeping.  No respiratory distress.  Left hip held externally rotated and flexed.  No tenderness of the right leg, left knee or ankle.  Medical screening examination/treatment/procedure(s) were conducted as a shared visit with non-physician practitioner(s) and myself.  I personally evaluated the patient during the encounter    Mancel BaleWentz, Klare Criss, MD 09/22/17 [phone]

## 2017-09-20 NOTE — H&P (Addendum)
History and Physical    Adriana FramesMildred Torres FAO:[id number] DOB: [birth year] DOA: 09/20/2017  Referring MD/NP/PA:   PCP: [facility]   Patient coming from:  The patient is coming from assisted living and memory care facility.  At baseline, pt dependent for most of ADL.   Chief Complaint: fall and left hip pain  HPI: Adriana FramesMildred Torres is a 83 y.o. female with medical history significant of severe dementia, who presents with fall and the left hip pain.  Per her daughters, pt had an unwitnessed fall at about noon when she was attempting to transfer from a chair to a wheelchair. She has severe pain in the left hip. No LOC. Her daughter strongly insist that pt did not  injured her head or neck. No leg shortening. Per her daughters, patient does not seem to have chest pain, cough, shortness breath, nausea, vomiting, diarrhea, abdominal pain, symptoms of UTI. No vision change or hearing loss. No unilateral numbness or tingliness in extremities. Of note, pt is taking low dose of lasix 10 mg daily for mild lower leg edema per her daughters.  ED Course: pt was found to have WBC 13.1, potassium 3.4, creatinine normal, temperature normal, no tachycardia, oxygen saturation 91-98% on room air. Patient is admitted to MedSurg bed as inpatient. Orthopedic surgeon, Dr. Lequita HaltAluisio was consulted by EDP.  # X-ray of hip/pelvis showed displaced fracture of the LEFT superior pubic ramus which extends into the LEFT acetabulum; and nondisplaced fracture LEFT inferior pubic ramus.   Review of Systems:   General: no fevers, chills, no body weight gain, has fatigue HEENT: no blurry vision, hearing changes or sore throat Respiratory: no dyspnea, coughing, wheezing CV: no chest pain, no palpitations GI: no nausea, vomiting, abdominal pain, diarrhea, constipation GU: no dysuria, burning on urination, increased urinary frequency, hematuria  Ext: has mild leg edema Neuro: no unilateral weakness, numbness, or  tingling, no vision change or hearing loss. had fall.  Skin: no rash, no skin tear. MSK: has left hip pain. Heme: No easy bruising.  Travel history: No recent long distant travel.  Allergy:  Allergies  Allergen Reactions  . Tramadol Other (See Comments)    Syncope  . Cod Liver Oil     UNKNOWN  . Fish Allergy     SCALLOPS, CRAB, CLAMS    Past Medical History:  Diagnosis Date  . Dementia   . Pneumonia     History reviewed. No pertinent surgical history.  Social History:  reports that  has never smoked. she has never used smokeless tobacco. She reports that she does not drink alcohol or use drugs.  Family History: Could not be reviewed accurately due to dementia.  Prior to Admission medications   Medication Sig Start Date End Date Taking? Authorizing Provider  furosemide (LASIX) 20 MG tablet Take 10 mg by mouth daily.    Yes [provider]  naproxen sodium (ANAPROX) 220 MG tablet Take 220 mg by mouth 2 (two) times daily with a meal.   Yes [provider]  ibuprofen (MOTRIN IB) 200 MG tablet Take 1 tablet (200 mg total) by mouth every 6 (six) hours as needed for moderate pain. Patient not taking: Reported on 09/20/2017 01/30/17   Eddie Northhungel, Nishant, MD    Physical Exam: Vitals:   09/20/17 1616 09/20/17 1617 09/20/17 1620 09/20/17 1911  BP: (!) 160/100  (!) 128/105 116/76  Pulse: 88  81 76  Resp: 20  (!) 21 17  Temp: 98.5 F (36.9 C)  98.1 F (  36.7 C)   TempSrc:   Oral   SpO2: 98%  95% 91%  Weight:  74.8 kg (165 lb)    Height:  5\' 3"  (1.6 m)     General: Not in acute distress HEENT:       Eyes: PERRL, EOMI, no scleral icterus.       ENT: No discharge from the ears and nose, no pharynx injection, no tonsillar enlargement.        Neck: No JVD, no bruit, no mass felt. Heme: No neck lymph node enlargement. Cardiac: S1/S2, RRR, No murmurs, No gallops or rubs. Respiratory:  No rales, wheezing, rhonchi or rubs. GI: Soft, nondistended, nontender, no  rebound pain, no organomegaly, BS present. GU: No hematuria Ext: has trace leg edema bilaterally. 2+DP/PT pulse bilaterally. Musculoskeletal: has tenderness in left hip Skin: No rashes.  Neuro: Alert, knows her own name and recognizes her daughters, not oriented X3, cranial nerves II-XII grossly intact, moves all extremities. Psych: Patient is not psychotic, no suicidal or hemocidal ideation.  Labs on Admission: I have personally reviewed following labs and imaging studies  CBC: Recent Labs  Lab 09/20/17 1900  WBC 13.1*  NEUTROABS 12.0*  HGB 12.6  HCT 37.6  MCV 88.7  PLT 155   Basic Metabolic Panel: Recent Labs  Lab 09/20/17 1900  NA 137  K 3.4*  CL 106  CO2 23  GLUCOSE 141*  BUN 19  CREATININE 0.70  CALCIUM 8.5*   GFR: Estimated Creatinine Clearance: 39.9 mL/min (by C-G formula based on SCr of 0.7 mg/dL). Liver Function Tests: No results for input(s): AST, ALT, ALKPHOS, BILITOT, PROT, ALBUMIN in the last 168 hours. No results for input(s): LIPASE, AMYLASE in the last 168 hours. No results for input(s): AMMONIA in the last 168 hours. Coagulation Profile: No results for input(s): INR, PROTIME in the last 168 hours. Cardiac Enzymes: No results for input(s): CKTOTAL, CKMB, CKMBINDEX, TROPONINI in the last 168 hours. BNP (last 3 results) No results for input(s): PROBNP in the last 8760 hours. HbA1C: No results for input(s): HGBA1C in the last 72 hours. CBG: No results for input(s): GLUCAP in the last 168 hours. Lipid Profile: No results for input(s): CHOL, HDL, LDLCALC, TRIG, CHOLHDL, LDLDIRECT in the last 72 hours. Thyroid Function Tests: No results for input(s): TSH, T4TOTAL, FREET4, T3FREE, THYROIDAB in the last 72 hours. Anemia Panel: No results for input(s): VITAMINB12, FOLATE, FERRITIN, TIBC, IRON, RETICCTPCT in the last 72 hours. Urine analysis:    Component Value Date/Time   COLORURINE YELLOW 01/31/2017 1617   APPEARANCEUR CLEAR 01/31/2017 1617    LABSPEC 1.012 01/31/2017 1617   PHURINE 6.0 01/31/2017 1617   GLUCOSEU NEGATIVE 01/31/2017 1617   HGBUR NEGATIVE 01/31/2017 1617   BILIRUBINUR NEGATIVE 01/31/2017 1617   KETONESUR 5 (A) 01/31/2017 1617   PROTEINUR NEGATIVE 01/31/2017 1617   NITRITE NEGATIVE 01/31/2017 1617   LEUKOCYTESUR NEGATIVE 01/31/2017 1617   Sepsis Labs: @LABRCNTIP (procalcitonin:4,lacticidven:4) )No results found for this or any previous visit (from the past 240 hour(s)).   Radiological Exams on Admission: Dg Hip Unilat W Or Wo Pelvis 2-3 Views Left  Result Date: 09/20/2017 CLINICAL DATA:  Unwitnessed fall today, LEFT hip pain EXAM: DG HIP (WITH OR WITHOUT PELVIS) 2-3V LEFT COMPARISON:  01/28/2017 FINDINGS: Diffuse osseous demineralization. Nondisplaced fracture inferior LEFT pubic ramus. Displaced fracture at the lateral aspect of the LEFT superior pubic ramus extending into the acetabulum. Mild widening of LEFT hip joint versus prior exam. No additional pelvic fractures identified. Degenerative changes at  the inferior lumbar spine. Proximal LEFT femur appears intact. IMPRESSION: Displaced fracture of the LEFT superior pubic ramus which extends into the LEFT acetabulum. Nondisplaced fracture LEFT inferior pubic ramus. Marked osseous demineralization. Electronically Signed   By: Ulyses Southward M.D.   On: 09/20/2017 17:29     EKG:  Not done in ED, will get one.   Assessment/Plan Principal Problem:   Fracture of pubic ramus [facility]) Active Problems:   Fall   Severe dementia   Hypokalemia   Fall and fracture of pubic ramus [facility]): Patient seems to have a mechanical fall. X-ray of hip/pelvis showed displaced fracture of the LEFT superior pubic ramus which extends into the LEFT acetabulum; and nondisplaced fracture LEFT inferior pubic ramus. No neurovascular compromise. Orthopedic surgeon, Dr. Lequita Halt was consulted. Her daughters strongly insist that the patient did not injure her head or neck, and refused CT-head and  CT-neck.  - will admit to Med-surg bed as in pt - Pain control: fentanyl prn and Norco; continue home naproxen - When necessary Zofran for nausea - Robaxin for muscle spasm - f/u Dr. Lequita Halt recommendations - type and cross - INR/PTT -consult to CM and SW for rehab SNF placement  Leukocytosis: Likely due to stress-induced demargination. Patient does not have signs of infection. -Follow-up CBC - get UA  Addendum: UA positive for UTI -start rocephin  F/u Bx and Ux.  Severe dementia: No behavior change -Observe closely  Hypokalemia: K= 3.4 on admission. - Repleted - Check Mg level    DVT ppx: SQ Lovenox Code Status: DNR (pt has yellow paper from facility with DNR. I also discussed with patient's daughters, and explained the meaning of CODE STATUS. Per daughers, patient would want to be DNR) Family Communication:   Yes, patient's 2 daughters  at bed side Disposition Plan:  Anticipate discharge back to previous home environment Consults called:  Ortho, Dr. Lequita Halt Admission status: medical floor/inpt  Date of Service 09/20/2017    Lorretta Harp [facility] Pager [phone]  If 7PM-7AM, please contact night-coverage [email or web address] Password [facility] 09/20/2017, 8:39 PM

## 2017-09-20 NOTE — Progress Notes (Signed)
Pharmacy Antibiotic Note  Adriana Torres is a 83 y.o. female admitted on 09/20/2017 with UTI.  Pharmacy has been consulted for rocephin dosing.  Plan: Rocephin 1 Gm IV q24h rx will sign off as no further adjustments are necessary  Height: 5\' 3"  (160 cm) Weight: 165 lb (74.8 kg) IBW/kg (Calculated) : 52.4  Temp (24hrs), Avg:98.4 F (36.9 C), Min:98.1 F (36.7 C), Max:98.5 F (36.9 C)  Recent Labs  Lab 09/20/17 1900  WBC 13.1*  CREATININE 0.70    Estimated Creatinine Clearance: 39.9 mL/min (by C-G formula based on SCr of 0.7 mg/dL).    Allergies  Allergen Reactions  . Tramadol Other (See Comments)    Syncope  . Cod Liver Oil     UNKNOWN  . Fish Allergy     SCALLOPS, CRAB, CLAMS    Antimicrobials this admission: 11/27 rocephin >>    >>   Dose adjustments this admission:   Microbiology results:  BCx:   UCx:    Sputum:    MRSA PCR:   Thank you for allowing pharmacy to be a part of this patient's care.  Lorenza EvangelistGreen, Manha Amato R 09/20/2017 11:22 PM

## 2017-09-20 NOTE — ED Provider Notes (Signed)
[facility]-EMERGENCY DEPT Provider Note   CSN: [id number] Arrival date & time: 09/20/17  1604     History   Chief Complaint Chief Complaint  Patient presents with  . Fall    HPI Kathalene FramesMildred Yonke is a 83 y.o. female.  Patient with witnessed fall at the nursing home while attempting to transfer from a chair to a wheelchair. Patient did not strike her head. Patient complaining of pain to left hip.    The history is provided by the EMS personnel and medical records. No language interpreter was used.  Fall  This is a new problem. The current episode started 1 to 2 hours ago.    Past Medical History:  Diagnosis Date  . Dementia   . Pneumonia     Patient Active Problem List   Diagnosis Date Noted  . Severe dementia 01/30/2017  . Syncope, vasovagal 01/28/2017  . HCAP (healthcare-associated pneumonia) 01/28/2017  . Fall     History reviewed. No pertinent surgical history.  OB History    No data available       Home Medications    Prior to Admission medications   Medication Sig Start Date End Date Taking? Authorizing Provider  acetaminophen (TYLENOL) 325 MG tablet Take 650 mg by mouth every 4 (four) hours as needed for mild pain.    [provider]  amoxicillin (AMOXIL) 875 MG tablet Take 875 mg by mouth 2 (two) times daily.    [provider]  Calcium Citrate-Vitamin D (CITRACAL PETITES/VITAMIN D) 200-250 MG-UNIT TABS Take 2 tablets by mouth 2 (two) times daily.    [provider]  cephALEXin (KEFLEX) 250 MG capsule Take 250 mg by mouth 4 (four) times daily. UTI Prevention    [provider]  furosemide (LASIX) 20 MG tablet Take 10 mg by mouth daily.     [provider]  ibuprofen (MOTRIN IB) 200 MG tablet Take 1 tablet (200 mg total) by mouth every 6 (six) hours as needed for moderate pain. 01/30/17   Dhungel, Theda BelfastNishant, MD  loperamide (IMODIUM A-D) 2 MG tablet Take 2 mg by mouth 4 (four) times daily as  needed for diarrhea or loose stools.    [provider]  naproxen sodium (ANAPROX) 220 MG tablet Take 220 mg by mouth 2 (two) times daily with a meal.    [provider]  nystatin (NYSTATIN) powder Apply 1 g topically 3 (three) times daily as needed (rash).    [provider]  ondansetron (ZOFRAN) 4 MG tablet Take 4 mg by mouth every 8 (eight) hours as needed for nausea or vomiting.    [provider]  sodium chloride (OCEAN) 0.65 % SOLN nasal spray Place 1 spray into both nostrils 2 (two) times daily as needed for congestion.    [provider]    Family History History reviewed. No pertinent family history.  Social History Social History   Tobacco Use  . Smoking status: Never Smoker  . Smokeless tobacco: Never Used  Substance Use Topics  . Alcohol use: No  . Drug use: No     Allergies   Tramadol   Review of Systems Review of Systems  Unable to perform ROS: Dementia     Physical Exam Updated Vital Signs BP (!) 128/105 (BP Location: Right Arm)   Pulse 81   Temp 98.1 F (36.7 C) (Oral)   Resp (!) 21   Ht 5\' 3"  (1.6 m)   Wt 74.8 kg (165 lb)  SpO2 95%   BMI 29.23 kg/m   Physical Exam  Constitutional: She appears well-developed and well-nourished.  HENT:  Head: Atraumatic.  Eyes: Conjunctivae are normal.  Neck: Normal range of motion. Neck supple.  Cardiovascular: Normal rate and regular rhythm.  Pulmonary/Chest: Effort normal and breath sounds normal.  Abdominal: Soft. She exhibits no distension.  Musculoskeletal: She exhibits tenderness. She exhibits no deformity.       Left hip: She exhibits tenderness.       Legs: Tenderness at left iliac crest  Neurological: She is alert. GCS eye subscore is 4. GCS verbal subscore is 3. GCS motor subscore is 6.  Skin: Skin is warm and dry.  Psychiatric: Her speech is normal and behavior is normal.     ED Treatments / Results  Labs (all labs ordered are listed, but only  abnormal results are displayed) Labs Reviewed  BASIC METABOLIC PANEL - Abnormal; Notable for the following components:      Result Value   Potassium 3.4 (*)    Glucose, Bld 141 (*)    Calcium 8.5 (*)    All other components within normal limits  CBC WITH DIFFERENTIAL/PLATELET - Abnormal; Notable for the following components:   WBC 13.1 (*)    Neutro Abs 12.0 (*)    Lymphs Abs 0.6 (*)    All other components within normal limits    EKG  EKG Interpretation None       Radiology Dg Hip Unilat W Or Wo Pelvis 2-3 Views Left  Result Date: 09/20/2017 CLINICAL DATA:  Unwitnessed fall today, LEFT hip pain EXAM: DG HIP (WITH OR WITHOUT PELVIS) 2-3V LEFT COMPARISON:  01/28/2017 FINDINGS: Diffuse osseous demineralization. Nondisplaced fracture inferior LEFT pubic ramus. Displaced fracture at the lateral aspect of the LEFT superior pubic ramus extending into the acetabulum. Mild widening of LEFT hip joint versus prior exam. No additional pelvic fractures identified. Degenerative changes at the inferior lumbar spine. Proximal LEFT femur appears intact. IMPRESSION: Displaced fracture of the LEFT superior pubic ramus which extends into the LEFT acetabulum. Nondisplaced fracture LEFT inferior pubic ramus. Marked osseous demineralization. Electronically Signed   By: Ulyses SouthwardMark  Boles M.D.   On: 09/20/2017 17:29    Procedures Procedures (including critical care time)  Medications Ordered in ED Medications - No data to display   Initial Impression / Assessment and Plan / ED Course  I have reviewed the triage vital signs and the nursing notes.  Pertinent labs & imaging results that were available during my care of the patient were reviewed by me and considered in my medical decision making (see chart for details).  83 year old female with fall at nursing home today. Hx of dementia and osteoporosis, otherwise healthy. Left superior ramus fracture on xray. Consult to orthopedics (Alluisio)--non weight  bearing, bed to chair transfer only. Hospitalist admit with placement in skilled nursing facility. Dr Alluisio happy to provide full consult.   Patient discussed with and seen by Dr. Effie ShyWentz    Final Clinical Impressions(s) / ED Diagnoses   Final diagnoses:  Pubic ramus fracture, right, closed, initial encounter [facility](HCC)    ED Discharge Orders    None       Felicie MornSmith, [place], NP 09/21/17 0017    Mancel BaleWentz, Elliott, MD 09/22/17 [phone]

## 2017-09-20 NOTE — ED Triage Notes (Signed)
Patient brought in by EMS from [facility] . Patient fell when she was transferring from chair to wheelchair. Patient is complaining of left hip pain. No LOC. No shortening of leg. Fall was witnessed and patient did not hit her head. Patient has severe dementia. No blood thinners.

## 2017-09-20 NOTE — ED Notes (Signed)
Patient placed on a purewick catheter due to not being able to move from pain.

## 2017-09-21 ENCOUNTER — Other Ambulatory Visit: Payer: Self-pay

## 2017-09-21 DIAGNOSIS — S32599A Other specified fracture of unspecified pubis, initial encounter for closed fracture: Secondary | ICD-10-CM

## 2017-09-21 DIAGNOSIS — F0390 Unspecified dementia without behavioral disturbance: Secondary | ICD-10-CM

## 2017-09-21 DIAGNOSIS — E876 Hypokalemia: Secondary | ICD-10-CM

## 2017-09-21 LAB — CBC
HCT: 35.7 % — ABNORMAL LOW (ref 36.0–46.0)
Hemoglobin: 11.9 g/dL — ABNORMAL LOW (ref 12.0–15.0)
MCH: 29.5 pg (ref 26.0–34.0)
MCHC: 33.3 g/dL (ref 30.0–36.0)
MCV: 88.4 fL (ref 78.0–100.0)
PLATELETS: 140 10*3/uL — AB (ref 150–400)
RBC: 4.04 MIL/uL (ref 3.87–5.11)
RDW: 13.7 % (ref 11.5–15.5)
WBC: 10.2 10*3/uL (ref 4.0–10.5)

## 2017-09-21 LAB — BASIC METABOLIC PANEL
Anion gap: 7 (ref 5–15)
BUN: 20 mg/dL (ref 6–20)
CALCIUM: 8.5 mg/dL — AB (ref 8.9–10.3)
CO2: 23 mmol/L (ref 22–32)
CREATININE: 0.57 mg/dL (ref 0.44–1.00)
Chloride: 107 mmol/L (ref 101–111)
GFR calc Af Amer: >60 mL/min (ref >60)
GFR calc non Af Amer: >60 mL/min (ref >60)
Glucose, Bld: 145 mg/dL — ABNORMAL HIGH (ref 65–99)
Potassium: 3.9 mmol/L (ref 3.5–5.1)
SODIUM: 137 mmol/L (ref 135–145)

## 2017-09-21 LAB — MAGNESIUM: Magnesium: 1.7 mg/dL (ref 1.7–2.4)

## 2017-09-21 LAB — MRSA PCR SCREENING: MRSA BY PCR: NEGATIVE

## 2017-09-21 MED ORDER — FENTANYL CITRATE (PF) 100 MCG/2ML IJ SOLN
12.5000 ug | INTRAMUSCULAR | Status: DC | PRN
  Administered 2017-09-21 – 2017-09-23 (×2): 12.5 ug via INTRAVENOUS
  Filled 2017-09-21: qty 2

## 2017-09-21 NOTE — Consult Note (Signed)
Reason for Consult:Left acetabular fracture Referring Physician: Dr. Lama  Adriana Torres is an 83 y.o. female.  HPI: 83 yo female resident of memory care center who was trying to get out of her wheelchair yesterday and fell onto her left side with immediate left hip pain and inability to get up. She was able to ambulate slightly with a walker prior to this injury but spent most of her time in a wheelchair. She was taken to the ED for evaluation and noted to have a medial wall acetabular fracture with extension to superior and inferior pubic rami. She has been admitted to the medical team for pain management and possible SNF placement.   Past Medical History:  Diagnosis Date  . Dementia   . Pneumonia     History reviewed. No pertinent surgical history.  History reviewed. No pertinent family history.  Social History:  reports that  has never smoked. she has never used smokeless tobacco. She reports that she does not drink alcohol or use drugs.  Allergies:  Allergies  Allergen Reactions  . Tramadol Other (See Comments)    Syncope  . Cod Liver Oil     UNKNOWN  . Fish Allergy     SCALLOPS, CRAB, CLAMS    Medications: I have reviewed the patient's current medications.  Results for orders placed or performed during the hospital encounter of 09/20/17 (from the past 48 hour(s))  Basic metabolic panel     Status: Abnormal   Collection Time: 09/20/17  7:00 PM  Result Value Ref Range   Sodium 137 135 - 145 mmol/L   Potassium 3.4 (L) 3.5 - 5.1 mmol/L   Chloride 106 101 - 111 mmol/L   CO2 23 22 - 32 mmol/L   Glucose, Bld 141 (H) 65 - 99 mg/dL   BUN 19 6 - 20 mg/dL   Creatinine, Ser 0.70 0.44 - 1.00 mg/dL   Calcium 8.5 (L) 8.9 - 10.3 mg/dL   GFR calc non Af Amer >60 >60 mL/min   GFR calc Af Amer >60 >60 mL/min    Comment: (NOTE) The eGFR has been calculated using the CKD EPI equation. This calculation has not been validated in all clinical situations. eGFR's persistently <60 mL/min  signify possible Chronic Kidney Disease.    Anion gap 8 5 - 15  CBC with Differential     Status: Abnormal   Collection Time: 09/20/17  7:00 PM  Result Value Ref Range   WBC 13.1 (H) 4.0 - 10.5 K/uL   RBC 4.24 3.87 - 5.11 MIL/uL   Hemoglobin 12.6 12.0 - 15.0 g/dL   HCT 37.6 36.0 - 46.0 %   MCV 88.7 78.0 - 100.0 fL   MCH 29.7 26.0 - 34.0 pg   MCHC 33.5 30.0 - 36.0 g/dL   RDW 13.8 11.5 - 15.5 %   Platelets 155 150 - 400 K/uL   Neutrophils Relative % 92 %   Neutro Abs 12.0 (H) 1.7 - 7.7 K/uL   Lymphocytes Relative 4 %   Lymphs Abs 0.6 (L) 0.7 - 4.0 K/uL   Monocytes Relative 4 %   Monocytes Absolute 0.6 0.1 - 1.0 K/uL   Eosinophils Relative 0 %   Eosinophils Absolute 0.0 0.0 - 0.7 K/uL   Basophils Relative 0 %   Basophils Absolute 0.0 0.0 - 0.1 K/uL  Urinalysis, Routine w reflex microscopic     Status: Abnormal   Collection Time: 09/20/17  9:55 PM  Result Value Ref Range   Color, Urine YELLOW   YELLOW   APPearance HAZY (A) CLEAR   Specific Gravity, Urine 1.018 1.005 - 1.030   pH 6.0 5.0 - 8.0   Glucose, UA NEGATIVE NEGATIVE mg/dL   Hgb urine dipstick NEGATIVE NEGATIVE   Bilirubin Urine NEGATIVE NEGATIVE   Ketones, ur 80 (A) NEGATIVE mg/dL   Protein, ur NEGATIVE NEGATIVE mg/dL   Nitrite POSITIVE (A) NEGATIVE   Leukocytes, UA TRACE (A) NEGATIVE   RBC / HPF 0-5 0 - 5 RBC/hpf   WBC, UA 6-30 0 - 5 WBC/hpf   Bacteria, UA MANY (A) NONE SEEN   Squamous Epithelial / LPF 0-5 (A) NONE SEEN   Mucus PRESENT    Amorphous Crystal PRESENT   Type and screen [name]     Status: None   Collection Time: 09/20/17 10:45 PM  Result Value Ref Range   ABO/RH(D) O POS    Antibody Screen NEG    Sample Expiration 09/23/2017   ABO/Rh     Status: None   Collection Time: 09/20/17 10:45 PM  Result Value Ref Range   ABO/RH(D) O POS   Protime-INR     Status: None   Collection Time: 09/20/17 10:50 PM  Result Value Ref Range   Prothrombin Time 15.0 11.4 - 15.2 seconds   INR  1.19   APTT     Status: None   Collection Time: 09/20/17 10:50 PM  Result Value Ref Range   aPTT 32 24 - 36 seconds  MRSA PCR Screening     Status: None   Collection Time: 09/21/17 12:50 AM  Result Value Ref Range   MRSA by PCR NEGATIVE NEGATIVE    Comment:        The GeneXpert MRSA Assay (FDA approved for NASAL specimens only), is one component of a comprehensive MRSA colonization surveillance program. It is not intended to diagnose MRSA infection nor to guide or monitor treatment for MRSA infections.   Magnesium     Status: None   Collection Time: 09/21/17  1:46 AM  Result Value Ref Range   Magnesium 1.7 1.7 - 2.4 mg/dL  CBC     Status: Abnormal   Collection Time: 09/21/17  1:46 AM  Result Value Ref Range   WBC 10.2 4.0 - 10.5 K/uL   RBC 4.04 3.87 - 5.11 MIL/uL   Hemoglobin 11.9 (L) 12.0 - 15.0 g/dL   HCT 35.7 (L) 36.0 - 46.0 %   MCV 88.4 78.0 - 100.0 fL   MCH 29.5 26.0 - 34.0 pg   MCHC 33.3 30.0 - 36.0 g/dL   RDW 13.7 11.5 - 15.5 %   Platelets 140 (L) 150 - 400 K/uL  Basic metabolic panel     Status: Abnormal   Collection Time: 09/21/17  1:46 AM  Result Value Ref Range   Sodium 137 135 - 145 mmol/L   Potassium 3.9 3.5 - 5.1 mmol/L   Chloride 107 101 - 111 mmol/L   CO2 23 22 - 32 mmol/L   Glucose, Bld 145 (H) 65 - 99 mg/dL   BUN 20 6 - 20 mg/dL   Creatinine, Ser 0.57 0.44 - 1.00 mg/dL   Calcium 8.5 (L) 8.9 - 10.3 mg/dL   GFR calc non Af Amer >60 >60 mL/min   GFR calc Af Amer >60 >60 mL/min    Comment: (NOTE) The eGFR has been calculated using the CKD EPI equation. This calculation has not been validated in all clinical situations. eGFR's persistently <60 mL/min signify possible Chronic Kidney Disease.      Anion gap 7 5 - 15    Dg Hip Unilat W Or Wo Pelvis 2-3 Views Left  Result Date: 09/20/2017 CLINICAL DATA:  Unwitnessed fall today, LEFT hip pain EXAM: DG HIP (WITH OR WITHOUT PELVIS) 2-3V LEFT COMPARISON:  01/28/2017 FINDINGS: Diffuse osseous  demineralization. Nondisplaced fracture inferior LEFT pubic ramus. Displaced fracture at the lateral aspect of the LEFT superior pubic ramus extending into the acetabulum. Mild widening of LEFT hip joint versus prior exam. No additional pelvic fractures identified. Degenerative changes at the inferior lumbar spine. Proximal LEFT femur appears intact. IMPRESSION: Displaced fracture of the LEFT superior pubic ramus which extends into the LEFT acetabulum. Nondisplaced fracture LEFT inferior pubic ramus. Marked osseous demineralization. Electronically Signed   By: Mark  Boles M.D.   On: 09/20/2017 17:29    ROS Blood pressure 121/67, pulse 80, temperature 98.1 F (36.7 C), temperature source Oral, resp. rate 17, height 5' 3" (1.6 m), weight 165 lb (74.8 kg), SpO2 98 %. Physical Exam  WD female lying in bed in NAD She has discomfort with any attempted left hip motion She can wiggle her toes and has intact pulses Her x-rays show a displaced left medial wall acetabular fracture with extension to superior and inferior pubic rami. The hip has slight protrusion medially. There is no associated femoral head or neck fracture  Assessment/Plan: Left acetabular fracture- This is a non-operative injury given the comminution of the fracture in soft osteoporotic bone, her pre-operative minimally ambulatory status, and her overall health condition. She may be non-weight bearing for transfers. If she has significant pain once the fracture heals, then hip replacement is a possibility but hopefully will not be necessary.We will folow while here. I discussed the injury and treatment course with her family and they agree with the plan    09/21/2017, 6:23 PM     

## 2017-09-21 NOTE — Evaluation (Signed)
Physical Therapy Evaluation Patient Details Name: Adriana Torres MRN: [id number] DOB: 1918/12/28 Today's Date: 09/21/2017   History of Present Illness  Pt admitted from memory care facility s/p fall with L displaced pelvic fxs - non-operative per ortho consult.  Clinical Impression  Pt admitted as above and presenting with functional mobility limitations 2* pain with movement, generalized weakness, balance deficits, NWB status of L LE and dementia related cognition.  Pt's daughters hope pt is able to return to memory care facility but this will be dependent on facility ability to provide level of assist pt currently needs to perform basic mobility tasks. - max assist of 2.      Follow Up Recommendations SNF    Equipment Recommendations  None recommended by PT    Recommendations for Other Services       Precautions / Restrictions Precautions Precautions: Fall Restrictions Weight Bearing Restrictions: Yes LLE Weight Bearing: Non weight bearing      Mobility  Bed Mobility Overal bed mobility: Needs Assistance Bed Mobility: Supine to Sit;Sit to Supine     Supine to sit: Mod assist;+2 for physical assistance;+2 for safety/equipment Sit to supine: Max assist;+2 for physical assistance;+2 for safety/equipment   General bed mobility comments: cues for sequence and use of R LE to self assist.  Pad under pt utilized to assist pt to/from EOB sitting  Transfers Overall transfer level: Needs assistance Equipment used: Rolling walker (2 wheeled) Transfers: Sit to/from Stand Sit to Stand: Mod assist;Max assist;+2 physical assistance;+2 safety/equipment;From elevated surface         General transfer comment: multi-modal cues for LE management, use of UEs to self assist and NWB on L LE.  Pt achieved semi-erect standing and maintained x 90 sec but continuously attempting WB on L LE  Ambulation/Gait             General Gait Details: NT - bed<>chair transfers only per Drs  order  Stairs            Wheelchair Mobility    Modified Rankin (Stroke Patients Only)       Balance Overall balance assessment: Needs assistance Sitting-balance support: Feet supported;No upper extremity supported Sitting balance-Leahy Scale: Good     Standing balance support: Bilateral upper extremity supported Standing balance-Leahy Scale: Poor                               Pertinent Vitals/Pain Pain Assessment: Faces Faces Pain Scale: Hurts even more Pain Location: L hip/pelvis with movement Pain Descriptors / Indicators: Grimacing;Guarding Pain Intervention(s): Limited activity within patient's tolerance;Monitored during session    Home Living Family/patient expects to be discharged to:: Unsure                 Additional Comments: pt resides at a Memory Care Unit     Prior Function Level of Independence: Needs assistance   Gait / Transfers Assistance Needed: Ltd ambulation with RW  ADL's / Homemaking Assistance Needed: requires assistance from assisted living staff for ADLs. pt can feed herself.  Comments: information provided by pt's daughters     Hand Dominance        Extremity/Trunk Assessment   Upper Extremity Assessment Upper Extremity Assessment: Generalized weakness    Lower Extremity Assessment Lower Extremity Assessment: Generalized weakness;LLE deficits/detail LLE: Unable to fully assess due to pain    Cervical / Trunk Assessment Cervical / Trunk Assessment: Kyphotic  Communication   Communication: No difficulties  Cognition Arousal/Alertness: Awake/alert Behavior During Therapy: WFL for tasks assessed/performed Overall Cognitive Status: History of cognitive impairments - at baseline                                        General Comments      Exercises     Assessment/Plan    PT Assessment Patient needs continued PT services  PT Problem List Decreased strength;Decreased range of  motion;Decreased activity tolerance;Decreased balance;Decreased mobility;Decreased cognition;Decreased knowledge of use of DME;Decreased safety awareness;Pain       PT Treatment Interventions DME instruction;Gait training;Functional mobility training;Therapeutic activities;Therapeutic exercise;Balance training;Cognitive remediation;Patient/family education    PT Goals (Current goals can be found in the Care Plan section)  Acute Rehab PT Goals Patient Stated Goal: I need to get up PT Goal Formulation: With patient Time For Goal Achievement: 09/28/17 Potential to Achieve Goals: Fair    Frequency Min 3X/week   Barriers to discharge        Co-evaluation               AM-PAC PT "6 Clicks" Daily Activity  Outcome Measure Difficulty turning over in bed (including adjusting bedclothes, sheets and blankets)?: Unable Difficulty moving from lying on back to sitting on the side of the bed? : Unable Difficulty sitting down on and standing up from a chair with arms (e.g., wheelchair, bedside commode, etc,.)?: Unable Help needed moving to and from a bed to chair (including a wheelchair)?: Total Help needed walking in hospital room?: Total Help needed climbing 3-5 steps with a railing? : Total 6 Click Score: 6    End of Session Equipment Utilized During Treatment: Gait belt Activity Tolerance: Patient limited by fatigue;Patient limited by pain Patient left: in bed;with call bell/phone within reach;with family/visitor present Nurse Communication: Mobility status PT Visit Diagnosis: Unsteadiness on feet (R26.81);Muscle weakness (generalized) (M62.81);Repeated falls (R29.6);Difficulty in walking, not elsewhere classified (R26.2);Pain Pain - Right/Left: Left Pain - part of body: Hip    Time: [id number] PT Time Calculation (min) (ACUTE ONLY): 32 min   Charges:   PT Evaluation $PT Eval Low Complexity: 1 Low PT Treatments $Therapeutic Activity: 8-22 mins   PT G Codes:        Pg [phone]   Maxten Shuler 09/21/2017, 5:22 PM

## 2017-09-21 NOTE — Progress Notes (Signed)
OT Cancellation Note  Patient Details Name: Kathalene FramesMildred Smithson MRN: [id number] DOB: [birth year]   Cancelled Treatment:    Reason Eval/Treat Not Completed: Other (comment).  Per notes, pt is from memory care and is dependent for most adls. Will defer OT evaluation to next venue of care  Maiya Kates 09/21/2017, 7:12 AM  Marica OtterMaryellen Nykia Turko, OTR/L [phone] 09/21/2017

## 2017-09-21 NOTE — Progress Notes (Signed)
[facility]  PROGRESS NOTE  Adriana Torres BJY:[id number] DOB: [birth year] DOA: 09/20/2017 PCP: [facility]   Brief HPI:    83 y.o. female with medical history significant of severe dementia, who presents with fall and the left hip pain.    Subjective   Patient seen and examined, denies pain.   Assessment/Plan:     1. Fracture of pubic ramus-status post mechanical fall, x-ray of the hip/pelvis showed displaced fracture of left superior pubic ramus which extends into left acetabulum.  And nondisplaced fracture left inferior pubic ramus.  Orthopedic surgeon was consulted.  Plan for conservative management.  Continue pain control with fentanyl and Norco.  Robaxin for muscle spasm. 2. UTI-patient has a normal UA, started on IV ceftriaxone.  Follow urine culture results. 3. Dementia-stable, no behavioral change. 4. Hypokalemia-replete, potassium was 3.9.    DVT prophylaxis: Lovenox  Code Status: DNR  Family Communication: Discussed with daughters at bedside   Disposition Plan: SNF   Consultants:  Orthopedics  Procedures:  None  Continuous infusions . sodium chloride 50 mL/hr at 09/21/17 0017  . cefTRIAXone (ROCEPHIN)  IV Stopped (09/21/17 0050)      Antibiotics:   Anti-infectives (From admission, onward)   Start     Dose/Rate Route Frequency Ordered Stop   09/20/17 2330  cefTRIAXone (ROCEPHIN) 1 g in dextrose 5 % 50 mL IVPB     1 g 100 mL/hr over 30 Minutes Intravenous Daily at bedtime 09/20/17 2322         Objective   Vitals:   09/21/17 0029 09/21/17 0512 09/21/17 1025 09/21/17 1418  BP: (!) 150/85 139/71 118/61 121/67  Pulse: 91 92 86 80  Resp: 18 17 18 17   Temp: 98.1 F (36.7 C) 98.1 F (36.7 C) 98 F (36.7 C) 98.1 F (36.7 C)  TempSrc: Oral Oral Oral Oral  SpO2: 99% 98% 97% 98%  Weight:      Height:        Intake/Output Summary (Last 24 hours) at 09/21/2017 1910 Last data filed at 09/21/2017 1800 Gross per 24 hour   Intake 541.66 ml  Output 625 ml  Net -83.34 ml   Filed Weights   09/20/17 1617  Weight: 74.8 kg (165 lb)     Physical Examination:   Physical Exam: Eyes: No icterus, extraocular muscles intact  Mouth: Oral mucosa is moist, no lesions on palate,  Neck: Supple, no deformities, masses, or tenderness Lungs: Normal respiratory effort, bilateral clear to auscultation, no crackles or wheezes.  Heart: Regular rate and rhythm, S1 and S2 normal, no murmurs, rubs auscultated Abdomen: BS normoactive,soft,nondistended,non-tender to palpation,no organomegaly Extremities: No pretibial edema, no erythema, no cyanosis, no clubbing Neuro : Alert and oriented x2       Data Reviewed: I have personally reviewed following labs and imaging studies  CBG: No results for input(s): GLUCAP in the last 168 hours.  CBC: Recent Labs  Lab 09/20/17 1900 09/21/17 0146  WBC 13.1* 10.2  NEUTROABS 12.0*  --   HGB 12.6 11.9*  HCT 37.6 35.7*  MCV 88.7 88.4  PLT 155 140*    Basic Metabolic Panel: Recent Labs  Lab 09/20/17 1900 09/21/17 0146  NA 137 137  K 3.4* 3.9  CL 106 107  CO2 23 23  GLUCOSE 141* 145*  BUN 19 20  CREATININE 0.70 0.57  CALCIUM 8.5* 8.5*  MG  --  1.7    Recent Results (from the past 240 hour(s))  MRSA PCR Screening  Status: None   Collection Time: 09/21/17 12:50 AM  Result Value Ref Range Status   MRSA by PCR NEGATIVE NEGATIVE Final    Comment:        The GeneXpert MRSA Assay (FDA approved for NASAL specimens only), is one component of a comprehensive MRSA colonization surveillance program. It is not intended to diagnose MRSA infection nor to guide or monitor treatment for MRSA infections.      Liver Function Tests: No results for input(s): AST, ALT, ALKPHOS, BILITOT, PROT, ALBUMIN in the last 168 hours. No results for input(s): LIPASE, AMYLASE in the last 168 hours. No results for input(s): AMMONIA in the last 168 hours.  Cardiac Enzymes: No  results for input(s): CKTOTAL, CKMB, CKMBINDEX, TROPONINI in the last 168 hours. BNP (last 3 results) No results for input(s): BNP in the last 8760 hours.  ProBNP (last 3 results) No results for input(s): PROBNP in the last 8760 hours.    Studies: Dg Hip Unilat W Or Wo Pelvis 2-3 Views Left  Result Date: 09/20/2017 CLINICAL DATA:  Unwitnessed fall today, LEFT hip pain EXAM: DG HIP (WITH OR WITHOUT PELVIS) 2-3V LEFT COMPARISON:  01/28/2017 FINDINGS: Diffuse osseous demineralization. Nondisplaced fracture inferior LEFT pubic ramus. Displaced fracture at the lateral aspect of the LEFT superior pubic ramus extending into the acetabulum. Mild widening of LEFT hip joint versus prior exam. No additional pelvic fractures identified. Degenerative changes at the inferior lumbar spine. Proximal LEFT femur appears intact. IMPRESSION: Displaced fracture of the LEFT superior pubic ramus which extends into the LEFT acetabulum. Nondisplaced fracture LEFT inferior pubic ramus. Marked osseous demineralization. Electronically Signed   By: Ulyses SouthwardMark  Boles M.D.   On: 09/20/2017 17:29    Scheduled Meds: . enoxaparin (LOVENOX) injection  40 mg Subcutaneous Q24H  . furosemide  10 mg Oral Daily  . naproxen sodium  275 mg Oral BID WC  . potassium chloride  20 mEq Oral Once      Time spent: 25 min  [name]   [facility] Pager [phone]. If 7PM-7AM, please contact night-coverage at [email or web address], Office  [phone]  password [email or web address]  09/21/2017, 7:10 PM  LOS: 1 day

## 2017-09-22 DIAGNOSIS — N390 Urinary tract infection, site not specified: Secondary | ICD-10-CM

## 2017-09-22 NOTE — Progress Notes (Signed)
Patient daughter and facility had a care meeting and have determine to allow patient to completed [facility].  CSW completed FL2, faxed clinical information to preferred SNF.  CSW will continue to follow.    Vivi BarrackNicole Brienne Liguori, Theresia MajorsLCSWA, MSW Clinical Social Worker  [phone] 09/22/2017  12:55 PM

## 2017-09-22 NOTE — [place] FL2 (Signed)
[place] MEDICAID FL2 LEVEL OF CARE SCREENING TOOL     IDENTIFICATION  Patient Name: Adriana Torres Birthdate: February 04, 1919 Sex: female Admission Date (Current Location): 09/20/2017  [facility] [place]   Facility and Address:  [facility],  [address], [place] [address]      Provider Number: [id number]  Attending Physician Name and Address:  Meredeth IdeLama, Gagan S, MD  Relative Name and Phone Number:       Current Level of Care: Hospital Recommended Level of Care: Skilled Nursing Facility Prior Approval Number:    Date Approved/Denied:   PASRR Number:   [id number]   Discharge Plan: Skilled Nursing Facility     Current Diagnoses: Patient Active Problem List   Diagnosis Date Noted  . Fracture of pubic ramus (HCC) 09/20/2017  . Hypokalemia 09/20/2017  . Severe dementia 01/30/2017  . Syncope, vasovagal 01/28/2017  . HCAP (healthcare-associated pneumonia) 01/28/2017  . Fall     Orientation RESPIRATION BLADDER Height & Weight     (Responds to Voice)  Normal Continent, External catheter Weight: 165 lb (74.8 kg) Height:  5\' 3"  (160 cm)  BEHAVIORAL SYMPTOMS/MOOD NEUROLOGICAL BOWEL NUTRITION STATUS      Continent Diet(Regular)  AMBULATORY STATUS COMMUNICATION OF NEEDS Skin   Extensive Assist Verbally(Dementia) Normal                       Personal Care Assistance Level of Assistance  Bathing, Feeding, Dressing Bathing Assistance: maximum assistance Feeding assistance: Limited assistance Dressing Assistance: maximum assistance     Functional Limitations Info  Sight, Hearing, Speech Sight Info: Adequate Hearing Info: Adequate Speech Info: Adequate    SPECIAL CARE FACTORS FREQUENCY  PT (By licensed PT), OT (By licensed OT)     PT Frequency: 5X/WEEK OT Frequency: 5X/WEEK            Contractures Contractures Info: Not present    Additional Factors Info  Code Status, Allergies Code Status Info: DNR Allergies Info:  Tramadol, Cod Liver Oil, Fish Allergy           Current Medications (09/22/2017):  This is the current hospital active medication list Current Facility-Administered Medications  Medication Dose Route Frequency Provider Last Rate Last Dose  . 0.9 %  sodium chloride infusion   Intravenous Continuous Leda GauzeKirby-Graham, Karen J, NP 50 mL/hr at 09/21/17 2128    . acetaminophen (TYLENOL) tablet 650 mg  650 mg Oral Q6H PRN Lorretta HarpNiu, Xilin, MD   650 mg at 09/22/17 1018  . cefTRIAXone (ROCEPHIN) 1 g in dextrose 5 % 50 mL IVPB  1 g Intravenous QHS Lorenza EvangelistGreen, Elizabeth R, Jackson SouthRPH   Stopped at 09/21/17 2203  . enoxaparin (LOVENOX) injection 40 mg  40 mg Subcutaneous Q24H Lorretta HarpNiu, Xilin, MD   40 mg at 09/21/17 2133  . fentaNYL (SUBLIMAZE) injection 12.5 mcg  12.5 mcg Intravenous Q4H PRN Leda GauzeKirby-Graham, Karen J, NP   12.5 mcg at 09/21/17 0020  . furosemide (LASIX) tablet 10 mg  10 mg Oral Daily Lorretta HarpNiu, Xilin, MD   10 mg at 09/22/17 1019  . HYDROcodone-acetaminophen (NORCO/VICODIN) 5-325 MG per tablet 1 tablet  1 tablet Oral Q4H PRN Lorretta HarpNiu, Xilin, MD   1 tablet at 09/21/17 1758  . methocarbamol (ROBAXIN) tablet 500 mg  500 mg Oral Q8H PRN Lorretta HarpNiu, Xilin, MD      . naproxen sodium (ANAPROX) tablet 275 mg  275 mg Oral BID WC Lorretta HarpNiu, Xilin, MD   275 mg at 09/22/17 1019  .  ondansetron (ZOFRAN) injection 4 mg  4 mg Intravenous Q8H PRN Lorretta HarpNiu, Xilin, MD      . polyethylene glycol (MIRALAX / GLYCOLAX) packet 17 g  17 g Oral Daily PRN Lorretta HarpNiu, Xilin, MD      . potassium chloride 20 MEQ/15ML (10%) solution 20 mEq  20 mEq Oral Once Lorretta HarpNiu, Xilin, MD      . zolpidem (AMBIEN) tablet 5 mg  5 mg Oral QHS PRN Lorretta HarpNiu, Xilin, MD         Discharge Medications: Please see discharge summary for a list of discharge medications.  Relevant Imaging Results:  Relevant Lab Results:   Additional Information [id number]  Clearance CootsNicole A Lysander Calixte, LCSW

## 2017-09-22 NOTE — Progress Notes (Signed)
   Subjective: Hospital day - 2 Patient reports pain as mild.   Patient seen in rounds by Dr. Lequita HaltAluisio. Patient is well, and has had no acute complaints or problems Plan is to go Skilled nursing facility after hospital stay.  Objective: Vital signs in last 24 hours: Temp:  [98 F (36.7 C)-99.4 F (37.4 C)] 98.6 F (37 C) (11/29 0521) Pulse Rate:  [77-92] 92 (11/29 0521) Resp:  [17-18] 18 (11/29 0521) BP: (92-121)/(44-70) 92/70 (11/29 0521) SpO2:  [94 %-98 %] 94 % (11/29 0521)  Intake/Output from previous day:  Intake/Output Summary (Last 24 hours) at 09/22/2017 0918 Last data filed at 09/22/2017 0600 Gross per 24 hour  Intake 720 ml  Output 600 ml  Net 120 ml    Intake/Output this shift: No intake/output data recorded.  Labs: Recent Labs    09/20/17 1900 09/21/17 0146  HGB 12.6 11.9*   Recent Labs    09/20/17 1900 09/21/17 0146  WBC 13.1* 10.2  RBC 4.24 4.04  HCT 37.6 35.7*  PLT 155 140*   Recent Labs    09/20/17 1900 09/21/17 0146  NA 137 137  K 3.4* 3.9  CL 106 107  CO2 23 23  BUN 19 20  CREATININE 0.70 0.57  GLUCOSE 141* 145*  CALCIUM 8.5* 8.5*   Recent Labs    09/20/17 2250  INR 1.19    EXAM General - Patient is Alert Extremity - Neurovascular intact Sensation intact distally Motor Function - intact, moving foot and toes well on exam.   Past Medical History:  Diagnosis Date  . Dementia   . Pneumonia     Assessment/Plan: Hospital day - 2 Principal Problem:   Fracture of pubic ramus (HCC) Active Problems:   Fall   Severe dementia   Hypokalemia  Estimated body mass index is 29.23 kg/m as calculated from the following:   Height as of this encounter: 5\' 3"  (1.6 m).   Weight as of this encounter: 74.8 kg (165 lb).  Due to the nature of the fracture, the patient has been placed NON WEIGHT BEARING, Bed to Chair Transfers only.  Avel Peacerew Perkins, PA-C Orthopaedic Surgery 09/22/2017, 9:18 AM

## 2017-09-22 NOTE — Progress Notes (Signed)
[facility]  PROGRESS NOTE  Adriana FramesMildred Torres ZDG:[id number]:[id number] DOB: Apr 30, 1919 DOA: 09/20/2017 PCP: [facility]   Brief HPI:    83 y.o. female with medical history significant of severe dementia, who presents with fall and the left hip pain.    Subjective   Patient seen and examined, denies chest pain or shortness of breath.   Assessment/Plan:     1. Fracture of pubic ramus-status post mechanical fall, x-ray of the hip/pelvis showed displaced fracture of left superior pubic ramus which extends into left acetabulum.  And nondisplaced fracture left inferior pubic ramus.  Orthopedic surgeon was consulted.  Plan for conservative management.  Continue pain control with fentanyl and Norco.  Robaxin for muscle spasm. 2. UTI-patient has a normal UA, urine culture growing greater than 100,000 colonies of gram-negative rods.  Started on IV ceftriaxone.  Follow urine culture results. 3. Dementia-stable, no behavioral change. 4. Hypokalemia-replete, potassium was 3.9.    DVT prophylaxis: Lovenox  Code Status: DNR  Family Communication: Discussed with daughters at bedside   Disposition Plan: SNF   Consultants:  Orthopedics  Procedures:  None  Continuous infusions . sodium chloride 50 mL/hr at 09/21/17 2128  . cefTRIAXone (ROCEPHIN)  IV Stopped (09/21/17 2203)      Antibiotics:   Anti-infectives (From admission, onward)   Start     Dose/Rate Route Frequency Ordered Stop   09/20/17 2330  cefTRIAXone (ROCEPHIN) 1 g in dextrose 5 % 50 mL IVPB     1 g 100 mL/hr over 30 Minutes Intravenous Daily at bedtime 09/20/17 2322         Objective   Vitals:   09/21/17 1418 09/21/17 2200 09/22/17 0521 09/22/17 1435  BP: 121/67 (!) 98/44 92/70 92/60   Pulse: 80 77 92 98  Resp: 17 18 18 18   Temp: 98.1 F (36.7 C) 99.4 F (37.4 C) 98.6 F (37 C) 98 F (36.7 C)  TempSrc: Oral Oral Oral Axillary  SpO2: 98% 96% 94% 95%  Weight:      Height:         Intake/Output Summary (Last 24 hours) at 09/22/2017 1441 Last data filed at 09/22/2017 1435 Gross per 24 hour  Intake 900 ml  Output 825 ml  Net 75 ml   Filed Weights   09/20/17 1617  Weight: 74.8 kg (165 lb)     Physical Examination:  Physical Exam: Eyes: No icterus, extraocular muscles intact  Mouth: Oral mucosa is moist, no lesions on palate,  Neck: Supple, no deformities, masses, or tenderness Lungs: Normal respiratory effort, bilateral clear to auscultation, no crackles or wheezes.  Heart: Regular rate and rhythm, S1 and S2 normal, no murmurs, rubs auscultated Abdomen: BS normoactive,soft,nondistended,non-tender to palpation,no organomegaly Extremities: No pretibial edema, no erythema, no cyanosis, no clubbing Neuro : Alert, oriented to self  Skin: No rashes seen on exam     Data Reviewed: I have personally reviewed following labs and imaging studies  CBG: No results for input(s): GLUCAP in the last 168 hours.  CBC: Recent Labs  Lab 09/20/17 1900 09/21/17 0146  WBC 13.1* 10.2  NEUTROABS 12.0*  --   HGB 12.6 11.9*  HCT 37.6 35.7*  MCV 88.7 88.4  PLT 155 140*    Basic Metabolic Panel: Recent Labs  Lab 09/20/17 1900 09/21/17 0146  NA 137 137  K 3.4* 3.9  CL 106 107  CO2 23 23  GLUCOSE 141* 145*  BUN 19 20  CREATININE 0.70 0.57  CALCIUM 8.5* 8.5*  MG  --  1.7    Recent Results (from the past 240 hour(s))  Culture, Urine     Status: Abnormal (Preliminary result)   Collection Time: 09/21/17 12:33 AM  Result Value Ref Range Status   Specimen Description URINE, RANDOM  Final   Special Requests NONE  Final   Culture (A)  Final    >=100,000 COLONIES/mL ESCHERICHIA COLI SUSCEPTIBILITIES TO FOLLOW Performed at [facility], [address] [place], [place] [address]    Report Status PENDING  Incomplete  MRSA PCR Screening     Status: None   Collection Time: 09/21/17 12:50 AM  Result Value Ref Range Status   MRSA by PCR NEGATIVE  NEGATIVE Final    Comment:        The GeneXpert MRSA Assay (FDA approved for NASAL specimens only), is one component of a comprehensive MRSA colonization surveillance program. It is not intended to diagnose MRSA infection nor to guide or monitor treatment for MRSA infections.   Culture, blood (Routine X 2) w Reflex to ID Panel     Status: None (Preliminary result)   Collection Time: 09/21/17  1:46 AM  Result Value Ref Range Status   Specimen Description BLOOD LEFT HAND  Final   Special Requests IN PEDIATRIC BOTTLE Blood Culture adequate volume  Final   Culture   Final    NO GROWTH 1 DAY Performed at [facility], [address] [place], [place] [address]    Report Status PENDING  Incomplete  Culture, blood (Routine X 2) w Reflex to ID Panel     Status: None (Preliminary result)   Collection Time: 09/21/17  1:46 AM  Result Value Ref Range Status   Specimen Description BLOOD RIGHT ANTECUBITAL  Final   Special Requests   Final    BOTTLES DRAWN AEROBIC AND ANAEROBIC Blood Culture adequate volume   Culture   Final    NO GROWTH 1 DAY Performed at [facility], [address] [place], [place] [address]    Report Status PENDING  Incomplete     Liver Function Tests: No results for input(s): AST, ALT, ALKPHOS, BILITOT, PROT, ALBUMIN in the last 168 hours. No results for input(s): LIPASE, AMYLASE in the last 168 hours. No results for input(s): AMMONIA in the last 168 hours.  Cardiac Enzymes: No results for input(s): CKTOTAL, CKMB, CKMBINDEX, TROPONINI in the last 168 hours. BNP (last 3 results) No results for input(s): BNP in the last 8760 hours.  ProBNP (last 3 results) No results for input(s): PROBNP in the last 8760 hours.    Studies: Dg Hip Unilat W Or Wo Pelvis 2-3 Views Left  Result Date: 09/20/2017 CLINICAL DATA:  Unwitnessed fall today, LEFT hip pain EXAM: DG HIP (WITH OR WITHOUT PELVIS) 2-3V LEFT COMPARISON:  01/28/2017 FINDINGS: Diffuse osseous  demineralization. Nondisplaced fracture inferior LEFT pubic ramus. Displaced fracture at the lateral aspect of the LEFT superior pubic ramus extending into the acetabulum. Mild widening of LEFT hip joint versus prior exam. No additional pelvic fractures identified. Degenerative changes at the inferior lumbar spine. Proximal LEFT femur appears intact. IMPRESSION: Displaced fracture of the LEFT superior pubic ramus which extends into the LEFT acetabulum. Nondisplaced fracture LEFT inferior pubic ramus. Marked osseous demineralization. Electronically Signed   By: Ulyses SouthwardMark  Boles M.D.   On: 09/20/2017 17:29    Scheduled Meds: . enoxaparin (LOVENOX) injection  40 mg Subcutaneous Q24H  . furosemide  10 mg Oral Daily  . naproxen sodium  275 mg Oral  BID WC  . potassium chloride  20 mEq Oral Once      Time spent: 25 min  Meredeth Ide   [facility] Pager [phone]. If 7PM-7AM, please contact night-coverage at [email or web address], Office  [phone]  password [email or web address]  09/22/2017, 2:41 PM  LOS: 2 days

## 2017-09-22 NOTE — Clinical Social Work Note (Signed)
Clinical Social Work Assessment  Patient Details  Name: Adriana Torres MRN: [id number] Date of Birth: Oct 27, 1918  Date of referral:  09/21/17               Reason for consult:  Facility Placement                Permission sought to share information with:  Family Supports, Oceanographeracility Contact Representative Permission granted to share information::     Name::        Agency::   [facility]  Relationship::   Daughters  Contact Information:     Housing/Transportation Living arrangements for the past 2 months:  (Memory Care) Source of Information:  Adult Children Patient Interpreter Needed:  None Criminal Activity/Legal Involvement Pertinent to Current Situation/Hospitalization:  No - Comment as needed Significant Relationships:  Adult Children Lives with:  Facility Resident(Memory Care Unit) Do you feel safe going back to the place where you live?  Yes Need for family participation in patient care:  Yes (Comment)  Care giving concerns:   [facility].  The patient has a fracture of the Pubic Ramus. The patient will not have a surgery intervention.  The patient is followed by Hospice services in the community,and will resume services at discharge.   The patient daughters prefer the patient to return to [facility] w/ Hospice.   Social Worker assessment / plan:  CSW discussed patient disposition and plan with pt. Daughters. Patient daughter prefer the patient to return to her current facility.  CSW talked with care management staff at the facility. They are agreeable for the patient to return w/ Hospice resuming her care.  Staff at the facility will talk with nurse Adriana Torres w/ Hospice to arrange services. She is is requesting the patient have orders placed for a hospital bed. FL2 completed.   Plan: Assist with discharge back to [facility].   Employment status:  Retired Health and safety inspectornsurance information:   Medicare PT Recommendations:  Skilled Nursing Facility Information / Referral to community resources:  Skilled Nursing Facility  Patient/Family's Response to care:  Patient daughter agreeable to care plan. Patient daughters appreciative of CSW services.   Patient/Family's Understanding of and Emotional Response to Diagnosis, Current Treatment, and Prognosis: Patient daughters at bedside, and very involved in patient care. They understand pt. current diagnosis and have discussed w/ staff at Memory care, treatment options for the continuation of patient care.   Emotional Assessment Appearance:  Appears stated age Attitude/Demeanor/Rapport:    Affect (typically observed):  Calm, Accepting Orientation:  Oriented to Self Alcohol / Substance use:  Not Applicable Psych involvement (Current and /or in the community):  No (Comment)  Discharge Needs  Concerns to be addressed:  Discharge Planning Concerns Readmission within the last 30 days:  No Current discharge risk:  Dependent with Mobility Barriers to Discharge:  Continued Medical Work up   [employer] A Adriana Ace, LCSW 09/22/2017, 9:14 AM

## 2017-09-23 DIAGNOSIS — S32591A Other specified fracture of right pubis, initial encounter for closed fracture: Secondary | ICD-10-CM

## 2017-09-23 LAB — URINE CULTURE: Culture: >=100000 — AB

## 2017-09-23 MED ORDER — HYDROCODONE-ACETAMINOPHEN 5-325 MG PO TABS: 1.0000 | ORAL_TABLET | ORAL | 0 refills | Status: AC | PRN

## 2017-09-23 MED ORDER — CEPHALEXIN 500 MG PO CAPS: 500.0000 mg | ORAL_CAPSULE | Freq: Two times a day (BID) | ORAL | 0 refills | Status: AC

## 2017-09-23 MED ORDER — METHOCARBAMOL 500 MG PO TABS: 500.0000 mg | ORAL_TABLET | Freq: Three times a day (TID) | ORAL | Status: AC | PRN

## 2017-09-23 NOTE — Progress Notes (Signed)
[facility]. [facility] DTRs present during transfer. All belongings w/ family.

## 2017-09-23 NOTE — Clinical Social Work Placement (Signed)
Patient accepted to [facility]- Patient to transport by [facility]. Nurse call report to: [phone] Daughters notified.   CLINICAL SOCIAL WORK PLACEMENT  NOTE  Date:  09/23/2017  Patient Details  Name: Kathalene FramesMildred Carrier MRN: [id number] Date of Birth: [birth year]  Clinical Social Work is seeking post-discharge placement for this patient at the Skilled  Nursing Facility level of care (*CSW will initial, date and re-position this form in  chart as items are completed):      Patient/family provided with [facility] Clinical Social Work Department's list of facilities offering this level of care within the geographic area requested by the patient (or if unable, by the patient's family).  Yes   Patient/family informed of their freedom to choose among providers that offer the needed level of care, that participate in Medicare, Medicaid or managed care program needed by the patient, have an available bed and are willing to accept the patient.      Patient/family informed of [facility]'s ownership interest in [facility] and [facility], as well as of the fact that they are under no obligation to receive care at these facilities.  PASRR submitted to EDS on 09/22/17     PASRR number received on 09/22/17     Existing PASRR number confirmed on       FL2 transmitted to all facilities in geographic area requested by pt/family on       FL2 transmitted to all facilities within larger geographic area on 09/22/17     Patient informed that his/her managed care company has contracts with or will negotiate with certain facilities, including the following:  [facility]     Yes   Patient/family informed of bed offers received.  Patient chooses bed at [facility]     Physician recommends and patient chooses bed at [facility] [place] [place]    Patient to be transferred to [facility] on 09/23/17.  Patient to be transferred to facility by [facility]     Patient family  notified on 09/23/17 of transfer.  Name of family member notified:  Duagher-Pat      PHYSICIAN Please sign DNR     Additional Comment:    _______________________________________________ Clearance CootsNicole A Quiara Killian, LCSW 09/23/2017, 10:20 AM

## 2017-09-23 NOTE — Progress Notes (Signed)
Subjective: Patient resting in bed. Easily arousable. No complaints of pain   Objective: Vital signs in last 24 hours: Temp:  [98 F (36.7 C)-99.6 F (37.6 C)] 98 F (36.7 C) (11/30 0534) Pulse Rate:  [83-98] 83 (11/30 0534) Resp:  [16-20] 16 (11/30 0534) BP: (92-120)/(53-62) 115/62 (11/30 0534) SpO2:  [95 %-100 %] 98 % (11/30 0534)  Intake/Output from previous day: 11/29 0701 - 11/30 0700 In: 1670 [P.O.:420; I.V.:1200; IV Piggyback:50] Out: 955 [Urine:955] Intake/Output this shift: Total I/O In: 666.7 [I.V.:616.7; IV Piggyback:50] Out: 355 [Urine:355]  Recent Labs    09/20/17 1900 09/21/17 0146  HGB 12.6 11.9*   Recent Labs    09/20/17 1900 09/21/17 0146  WBC 13.1* 10.2  RBC 4.24 4.04  HCT 37.6 35.7*  PLT 155 140*   Recent Labs    09/20/17 1900 09/21/17 0146  NA 137 137  K 3.4* 3.9  CL 106 107  CO2 23 23  BUN 19 20  CREATININE 0.70 0.57  GLUCOSE 141* 145*  CALCIUM 8.5* 8.5*   Recent Labs    09/20/17 2250  INR 1.19    Has discomfort with any attempted motion of left leg  Assessment/Plan: Left acetabular fracture- Continue non-weight bearing. OK for discharge from our standpoint. Follow up in office in 4 weeks for x-ray   Ollen GrossFrank Aubriauna Riner 09/23/2017, 6:52 AM

## 2017-09-23 NOTE — Discharge Summary (Signed)
Physician Discharge Summary  Nesreen Albano ZOX:[id number] DOB: Apr 12, 1919 DOA: 09/20/2017  PCP: Almetta Lovely, [facility]  Admit date: 09/20/2017 Discharge date: 09/23/2017  Time spent: 35 minutes  Recommendations for Outpatient Follow-up:  1. Patient to be discharged to skilled nursing facility 2. Follow-up orthopedics Dr. Despina Hick in 4 weeks   Discharge Diagnoses:  Principal Problem:   Fracture of pubic ramus [facility]) Active Problems:   Fall   Severe dementia   Hypokalemia   Discharge Condition: Stable  Diet recommendation: Regular diet  Filed Weights   09/20/17 1617  Weight: 74.8 kg (165 lb)    History of present illness:  83 y.o. female with medical history significant of severe dementia, who presents with fall and the left hip pain.  Per her daughters, pt had an unwitnessed fall at about noon when she was attempting to transfer from a chair to a wheelchair. She has severe pain in the left hip. No LOC. Her daughter strongly insist that pt did not  injured her head or neck. No leg shortening. Per her daughters, patient does not seem to have chest pain, cough, shortness breath, nausea, vomiting, diarrhea, abdominal pain, symptoms of UTI. No vision change or hearing loss. No unilateral numbness or tingliness in extremities. Of note, pt is taking low dose of lasix 10 mg daily for mild lower leg edema per her daughters.    Hospital Course:  1. Fracture of pubic ramus-status post mechanical fall, x-ray of the hip/pelvis showed displaced fracture of left superior pubic ramus which extends into left acetabulum.  And nondisplaced fracture left inferior pubic ramus.  Orthopedic surgeon was consulted.  Plan for conservative management.  Continue pain control with  Norco.  Robaxin for muscle spasm.  Patient was seen by orthopedic surgeon, he will follow patient in 4 weeks continue nonweightbearing 2. UTI-patient has a normal UA, urine culture growing E. coli sensitive to cefazolin.   Patient was started on IV ceftriaxone in the hospital and received 3 days of treatment.  Will discharge on Keflex 500 mg twice daily for 5 more days.  3. Dementia-stable, no behavioral change. 4. Hypokalemia-replete, potassium was 3.9.     Procedures:  None  Consultations:  Orthopedics  Discharge Exam: Vitals:   09/22/17 2135 09/23/17 0534  BP: (!) 120/53 115/62  Pulse: 83 83  Resp: 20 16  Temp: 99.6 F (37.6 C) 98 F (36.7 C)  SpO2: 100% 98%    General: Appears in no acute distress Cardiovascular: S1-S2, regular Respiratory: Clear to auscultation bilaterally  Discharge Instructions   Discharge Instructions    Diet - low sodium heart healthy   Complete by:  As directed    Discharge instructions   Complete by:  As directed    Non   Continue non weight bearing     Allergies as of 09/23/2017      Reactions   Tramadol Other (See Comments)   Syncope   Cod Liver Oil    UNKNOWN   Fish Allergy    SCALLOPS, CRAB, CLAMS      Medication List    STOP taking these medications   ibuprofen 200 MG tablet Commonly known as:  MOTRIN IB     TAKE these medications   cephALEXin 500 MG capsule Commonly known as:  KEFLEX Take 1 capsule (500 mg total) by mouth 2 (two) times daily for 5 days.   furosemide 20 MG tablet Commonly known as:  LASIX Take 10 mg by mouth daily.   HYDROcodone-acetaminophen 5-325 MG tablet Commonly  known as:  NORCO/VICODIN Take 1 tablet by mouth every 4 (four) hours as needed for moderate pain.   methocarbamol 500 MG tablet Commonly known as:  ROBAXIN Take 1 tablet (500 mg total) by mouth every 8 (eight) hours as needed for muscle spasms.   naproxen sodium 220 MG tablet Commonly known as:  ALEVE Take 220 mg by mouth 2 (two) times daily with a meal.            Durable Medical Equipment  (From admission, onward)        Start     Ordered   09/22/17 1234  For home use only DME Hospital bed  Once    Question:  Bed type  Answer:   Semi-electric   09/22/17 1233     Allergies  Allergen Reactions  . Tramadol Other (See Comments)    Syncope  . Cod Liver Oil     UNKNOWN  . Fish Allergy     SCALLOPS, CRAB, CLAMS    Contact information for follow-up providers    Aluisio, Homero FellersFrank, MD. Schedule an appointment as soon as possible for a visit in 4 week(s).   Specialty:  Orthopedic Surgery Contact information: [address] [place] [address] [phone]            Contact information for after-discharge care    Destination    [facility] .   Service:  Skilled Nursing Contact information: [address] [place] [id number] [phone]                   The results of significant diagnostics from this hospitalization (including imaging, microbiology, ancillary and laboratory) are listed below for reference.    Significant Diagnostic Studies: Dg Hip Unilat W Or Wo Pelvis 2-3 Views Left  Result Date: 09/20/2017 CLINICAL DATA:  Unwitnessed fall today, LEFT hip pain EXAM: DG HIP (WITH OR WITHOUT PELVIS) 2-3V LEFT COMPARISON:  01/28/2017 FINDINGS: Diffuse osseous demineralization. Nondisplaced fracture inferior LEFT pubic ramus. Displaced fracture at the lateral aspect of the LEFT superior pubic ramus extending into the acetabulum. Mild widening of LEFT hip joint versus prior exam. No additional pelvic fractures identified. Degenerative changes at the inferior lumbar spine. Proximal LEFT femur appears intact. IMPRESSION: Displaced fracture of the LEFT superior pubic ramus which extends into the LEFT acetabulum. Nondisplaced fracture LEFT inferior pubic ramus. Marked osseous demineralization. Electronically Signed   By: Ulyses SouthwardMark  Boles M.D.   On: 09/20/2017 17:29    Microbiology: Recent Results (from the past 240 hour(s))  Culture, Urine     Status: Abnormal   Collection Time: 09/21/17 12:33 AM  Result Value Ref Range Status   Specimen  Description URINE, RANDOM  Final   Special Requests NONE  Final   Culture >=100,000 COLONIES/mL ESCHERICHIA COLI (A)  Final   Report Status 09/23/2017 FINAL  Final   Organism ID, Bacteria ESCHERICHIA COLI (A)  Final      Susceptibility   Escherichia coli - MIC*    AMPICILLIN <=2 SENSITIVE Sensitive     CEFAZOLIN <=4 SENSITIVE Sensitive     CEFTRIAXONE <=1 SENSITIVE Sensitive     CIPROFLOXACIN <=0.25 SENSITIVE Sensitive     GENTAMICIN <=1 SENSITIVE Sensitive     IMIPENEM <=0.25 SENSITIVE Sensitive     NITROFURANTOIN <=16 SENSITIVE Sensitive     TRIMETH/SULFA <=20 SENSITIVE Sensitive     AMPICILLIN/SULBACTAM <=2 SENSITIVE Sensitive     PIP/TAZO <=4 SENSITIVE Sensitive     Extended  ESBL NEGATIVE Sensitive     * >=100,000 COLONIES/mL ESCHERICHIA COLI  MRSA PCR Screening     Status: None   Collection Time: 09/21/17 12:50 AM  Result Value Ref Range Status   MRSA by PCR NEGATIVE NEGATIVE Final    Comment:        The GeneXpert MRSA Assay (FDA approved for NASAL specimens only), is one component of a comprehensive MRSA colonization surveillance program. It is not intended to diagnose MRSA infection nor to guide or monitor treatment for MRSA infections.   Culture, blood (Routine X 2) w Reflex to ID Panel     Status: None (Preliminary result)   Collection Time: 09/21/17  1:46 AM  Result Value Ref Range Status   Specimen Description BLOOD LEFT HAND  Final   Special Requests IN PEDIATRIC BOTTLE Blood Culture adequate volume  Final   Culture   Final    NO GROWTH 1 DAY Performed at [facility], [address] [place], [place] [address]    Report Status PENDING  Incomplete  Culture, blood (Routine X 2) w Reflex to ID Panel     Status: None (Preliminary result)   Collection Time: 09/21/17  1:46 AM  Result Value Ref Range Status   Specimen Description BLOOD RIGHT ANTECUBITAL  Final   Special Requests   Final    BOTTLES DRAWN AEROBIC AND ANAEROBIC Blood Culture adequate volume    Culture   Final    NO GROWTH 1 DAY Performed at [facility], [address] [place], [place] [address]    Report Status PENDING  Incomplete     Labs: Basic Metabolic Panel: Recent Labs  Lab 09/20/17 1900 09/21/17 0146  NA 137 137  K 3.4* 3.9  CL 106 107  CO2 23 23  GLUCOSE 141* 145*  BUN 19 20  CREATININE 0.70 0.57  CALCIUM 8.5* 8.5*  MG  --  1.7   Liver Function Tests: No results for input(s): AST, ALT, ALKPHOS, BILITOT, PROT, ALBUMIN in the last 168 hours. No results for input(s): LIPASE, AMYLASE in the last 168 hours. No results for input(s): AMMONIA in the last 168 hours. CBC: Recent Labs  Lab 09/20/17 1900 09/21/17 0146  WBC 13.1* 10.2  NEUTROABS 12.0*  --   HGB 12.6 11.9*  HCT 37.6 35.7*  MCV 88.7 88.4  PLT 155 140*    Signed:  Meredeth IdeGagan S Rayshun Kandler MD.  [facility] 09/23/2017, 9:52 AM

## 2017-09-26 LAB — CULTURE, BLOOD (ROUTINE X 2)
CULTURE: NO GROWTH
CULTURE: NO GROWTH
Special Requests: ADEQUATE
Special Requests: ADEQUATE

## 2018-03-20 IMAGING — CT CT HEAD W/O CM
5 of 9 series · 21 of 47 positions shown, 23 images · non-contrast
Comparison: CT cervical spine dated 02/17/2015

CLINICAL DATA: Fall, left head pain/injury, altered mental status

EXAM:
CT HEAD WITHOUT CONTRAST
CT CERVICAL SPINE WITHOUT CONTRAST
TECHNIQUE: Multidetector CT imaging of the head and cervical spine was
performed following the standard protocol without intravenous
contrast. Multiplanar CT image reconstructions of the cervical spine
were also generated.

[Series 203: coronal st, idose (1) · coronal · 0.40mm/px · 3 of 72 slices shown]
[im 27/72  brain]
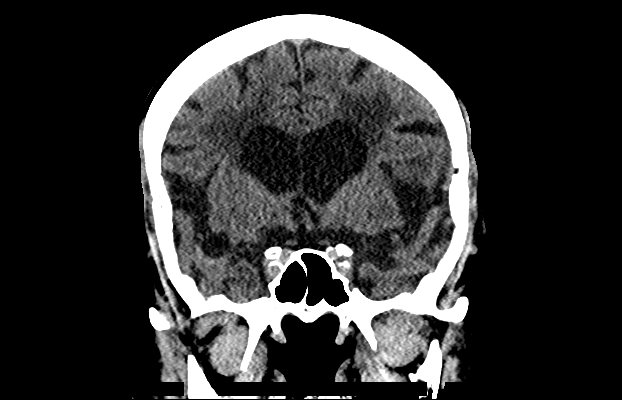
[im 36/72  brain]
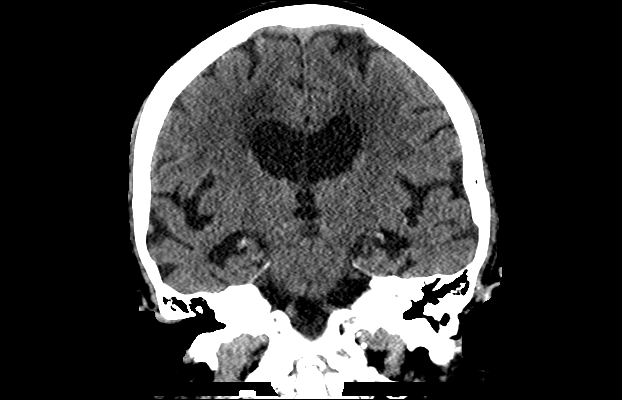
[im 45/72  brain]
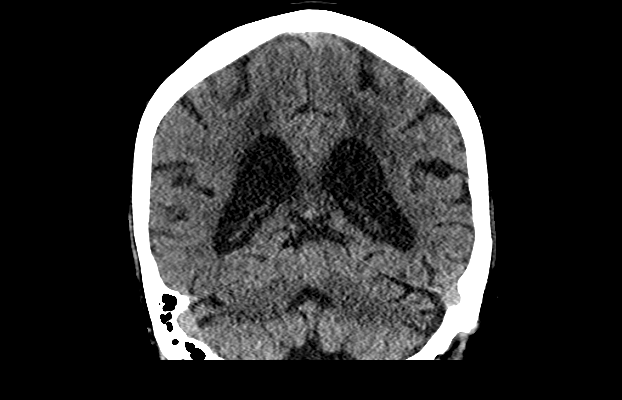

[Series 204: sagittal st, idose (1) · sagittal · 0.40mm/px · 2 of 61 slices shown]
[im 21/61  brain]
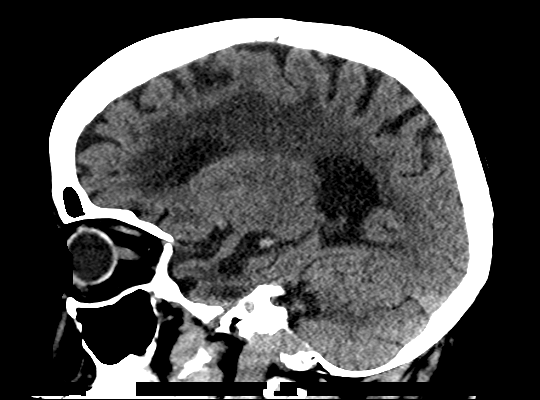
[im 41/61  brain]
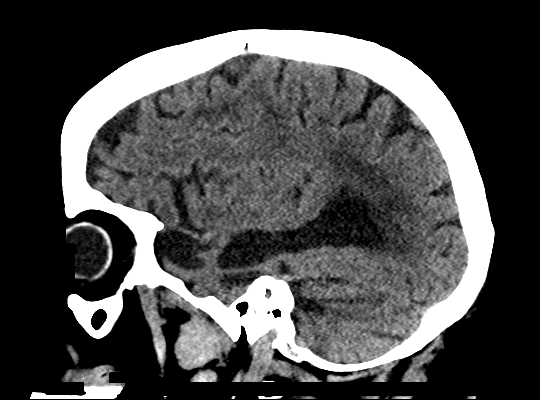

[Series 302: soft tissue, idose (2) · axial · 0.37mm/px · z∈[+72,+224]mm · 7 of 102 slices shown, 9 images]
[im 13/102  brain]
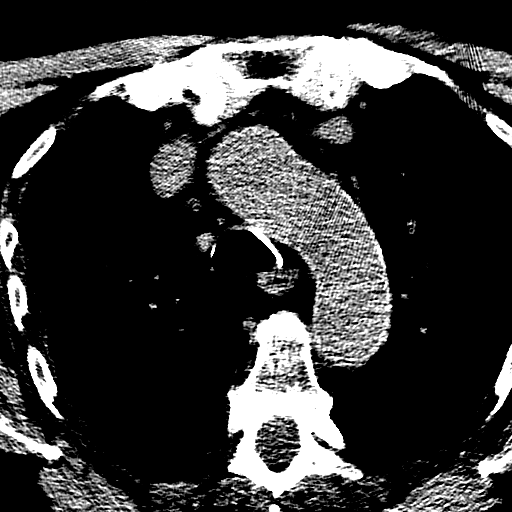
[im 13/102  bone]
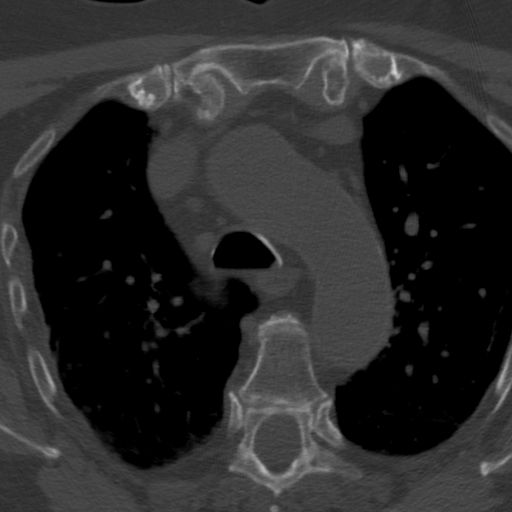
[im 26/102  brain]
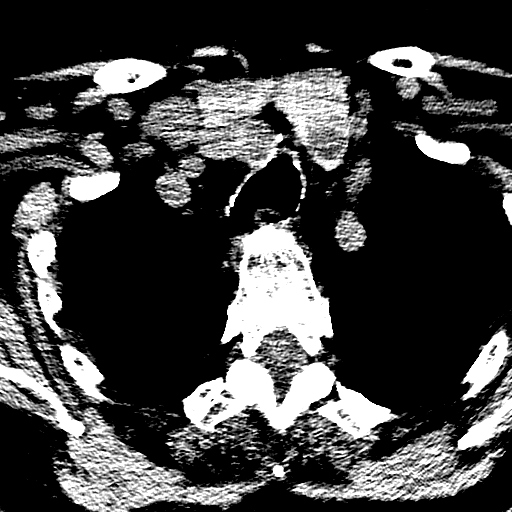
[im 38/102  brain]
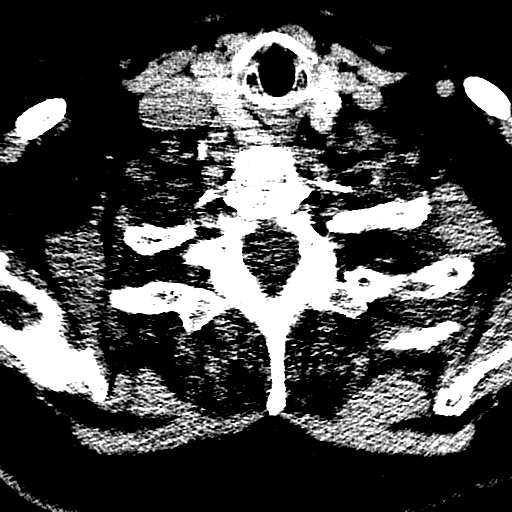
[im 51/102  brain]
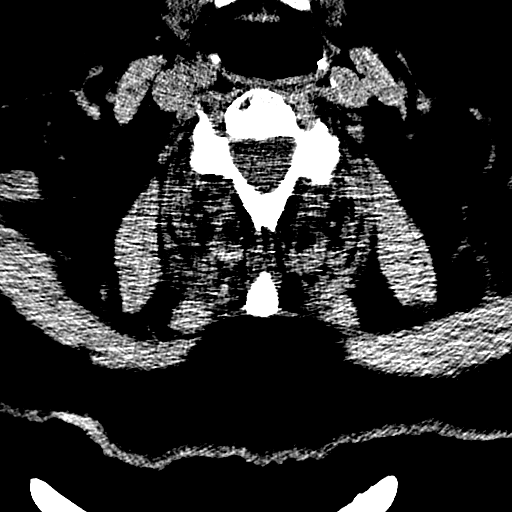
[im 64/102  brain]
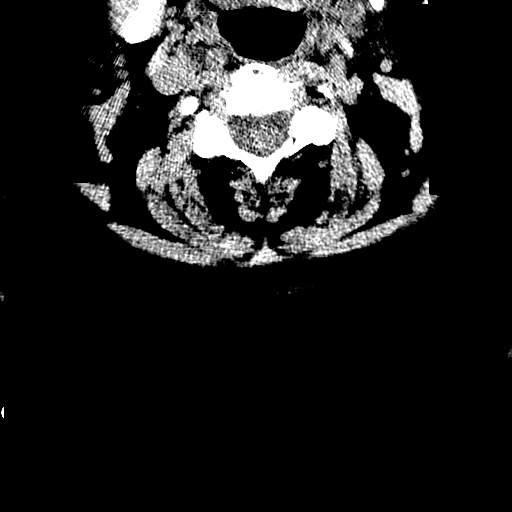
[im 64/102  bone]
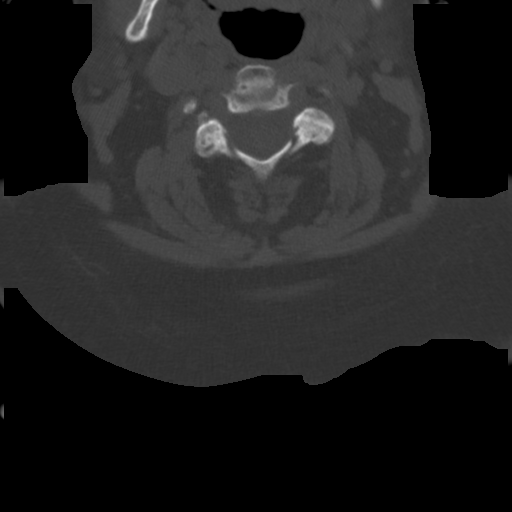
[im 76/102  brain]
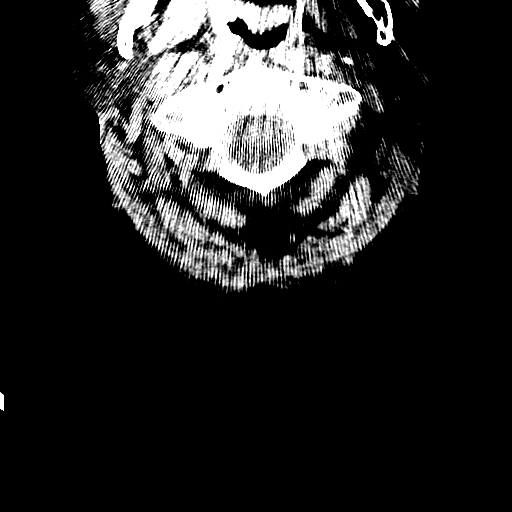
[im 89/102  brain]
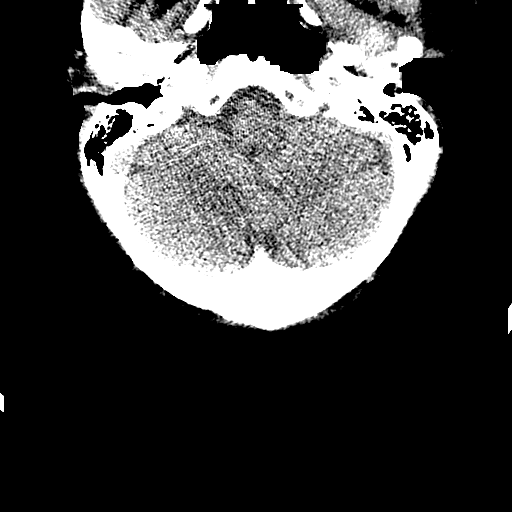

[Series 306: orthogonals, idose (2) · axial · 0.43mm/px · z∈[+51,+187]mm · 7 of 100 slices shown]
[im 13/100  brain]
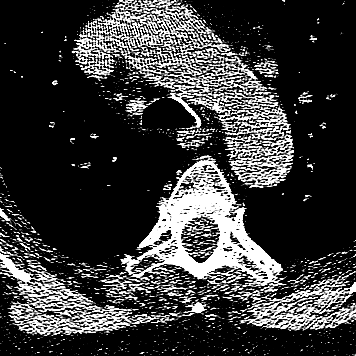
[im 25/100  brain]
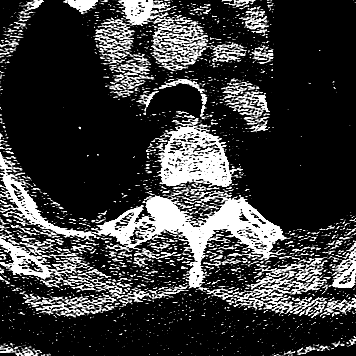
[im 38/100  brain]
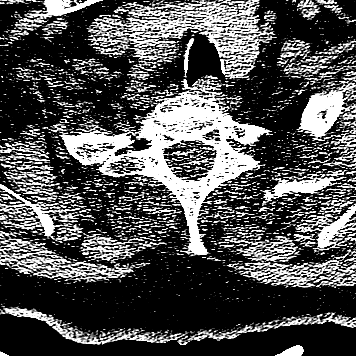
[im 50/100  brain]
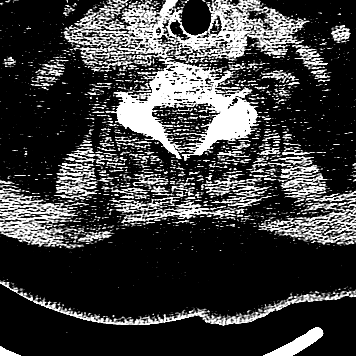
[im 62/100  brain]
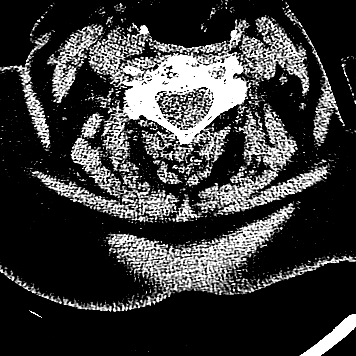
[im 75/100  brain]
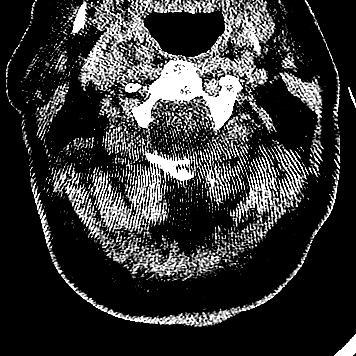
[im 87/100  brain]
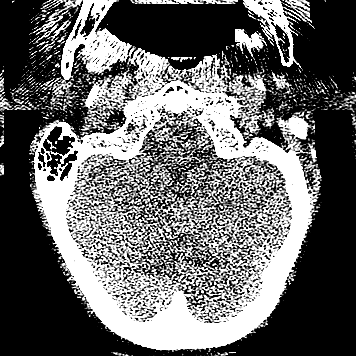

[Series 307: coronal 2, idose (2) · axial · 0.43mm/px · z∈[+178,+202]mm · 2 of 47 slices shown]
[im 16/47  brain]
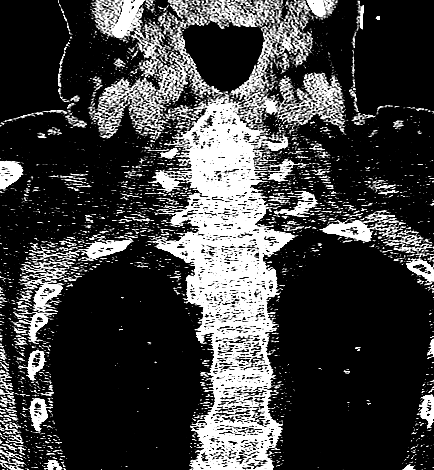
[im 31/47  brain]
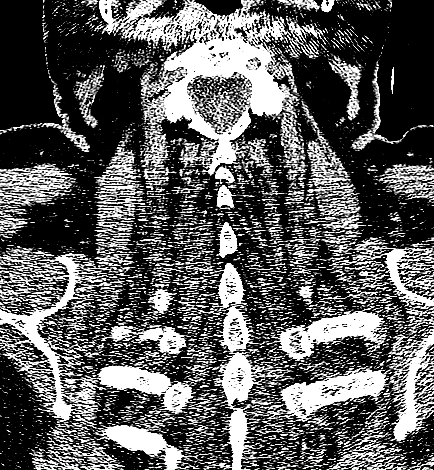

[21 of 47 positions shown; findings below may reference images not displayed]

FINDINGS: CT HEAD FINDINGS

Brain: No evidence of acute infarction, hemorrhage, hydrocephalus,
extra-axial collection or mass lesion/mass effect.

Subcortical white matter and periventricular small vessel ischemic
changes.

Global cortical and central atrophy. Secondary ventricular
prominence.

Vascular: Mild intracranial atherosclerosis.

Skull: Normal. Negative for fracture or focal lesion.

Sinuses/Orbits: The visualized paranasal sinuses are essentially
clear. The mastoid air cells are unopacified.

Other: None.

CT CERVICAL SPINE FINDINGS

Alignment: Normal cervical lordosis.

Skull base and vertebrae: No acute fracture. No primary bone lesion
or focal pathologic process.

Soft tissues and spinal canal: No prevertebral soft tissue swelling.

Spinal canal is widely patent.

Disc levels:  Mild multilevel degenerative changes.

Upper chest: Visualized lung apices are clear.

Other: Visualized thyroid is mildly heterogeneous/nodular.
IMPRESSION: No evidence of acute intracranial abnormality. Atrophy with small
vessel ischemic changes.

No evidence of traumatic injury to the cervical spine. Mild
multilevel degenerative changes.

## 2018-03-21 IMAGING — CR DG CHEST 1V PORT
1 series · 1 of 1 positions shown · non-contrast
Comparison: Yesterday

CLINICAL DATA: Syncope.  History of pneumonia

EXAM:
PORTABLE CHEST 1 VIEW

[AP]
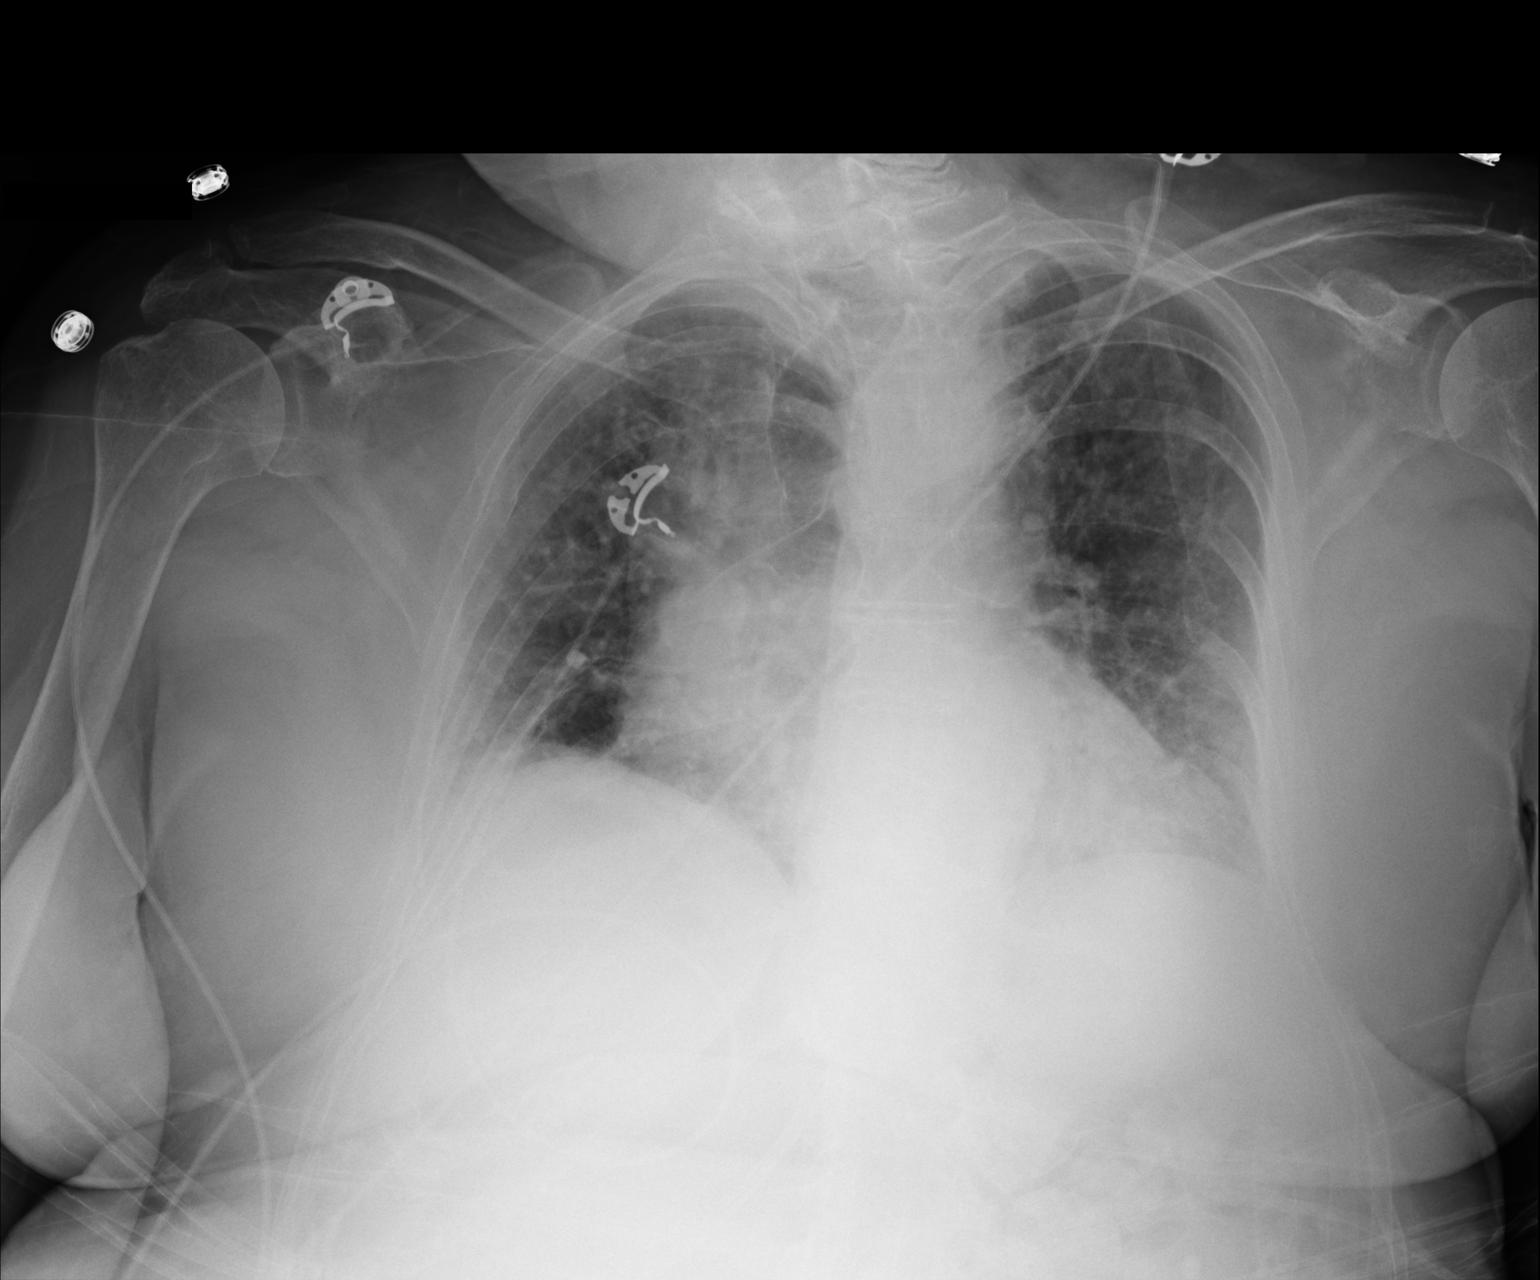

[1 of 1 positions shown; findings below may reference images not displayed]

FINDINGS: Cardiomegaly which is accentuated by rotation, as is the upper
mediastinal widening. Low volume chest with interstitial coarsening.
There is interstitial crowding and hazy density at the bases. No
Kerley lines, effusion, or pneumothorax.
IMPRESSION: Limited low volume chest that is stable from yesterday. Pneumonia or
atelectasis may be present at the bases.
